# Patient Record
Sex: Male | Born: 1995
Health system: Southern US, Community
[De-identification: ages and names within clinical notes are randomized; demographics above are authoritative.]

## PROBLEM LIST (undated history)

## (undated) DIAGNOSIS — I639 Cerebral infarction, unspecified: Secondary | ICD-10-CM

## (undated) DIAGNOSIS — G43909 Migraine, unspecified, not intractable, without status migrainosus: Secondary | ICD-10-CM

## (undated) DIAGNOSIS — D571 Sickle-cell disease without crisis: Secondary | ICD-10-CM

## (undated) HISTORY — PX: CHOLECYSTECTOMY: SHX55

---
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
## 2014-01-23 ENCOUNTER — Encounter: Payer: Self-pay | Admitting: Internal Medicine

## 2014-01-23 ENCOUNTER — Ambulatory Visit (INDEPENDENT_AMBULATORY_CARE_PROVIDER_SITE_OTHER): Payer: BC Managed Care – PPO | Admitting: Internal Medicine

## 2014-01-23 VITALS — BP 131/46 | HR 68 | Temp 98.1°F | Resp 16 | Ht 66.5 in | Wt 157.0 lb

## 2014-01-23 DIAGNOSIS — D571 Sickle-cell disease without crisis: Secondary | ICD-10-CM

## 2014-01-23 LAB — COMPREHENSIVE METABOLIC PANEL
ALT: 17 U/L (ref 0–53)
AST: 41 U/L — AB (ref 0–37)
Albumin: 4.5 g/dL (ref 3.5–5.2)
Alkaline Phosphatase: 77 U/L (ref 39–117)
BILIRUBIN TOTAL: 3.2 mg/dL — AB (ref 0.2–1.1)
BUN: 7 mg/dL (ref 6–23)
CALCIUM: 9.2 mg/dL (ref 8.4–10.5)
CO2: 25 mEq/L (ref 19–32)
Chloride: 102 mEq/L (ref 96–112)
Creat: 0.66 mg/dL (ref 0.50–1.35)
Glucose, Bld: 86 mg/dL (ref 70–99)
Potassium: 4.6 mEq/L (ref 3.5–5.3)
Sodium: 136 mEq/L (ref 135–145)
Total Protein: 7.2 g/dL (ref 6.0–8.3)

## 2014-01-23 LAB — FERRITIN: FERRITIN: 46 ng/mL (ref 22–322)

## 2014-01-23 LAB — LACTATE DEHYDROGENASE: LDH: 469 U/L — ABNORMAL HIGH (ref 94–250)

## 2014-01-23 NOTE — Progress Notes (Signed)
Patient ID: Ryan Fowler, male   DOB: 04/23/1996, 18 y.o.   MRN: [id number]   Haynes Giannotti, is a 18 y.o. male  [id number]  [id number]  DOB - April 20, 1996  CC:  Chief Complaint  Patient presents with  . Establish Care       HPI: Renji Berwick is a 18 y.o. male here today to establish medical care. He is a pleasant 18 y/o with Hb SS who has previously been managed by Pediatric Hematology (Dr. Sinda Du) at [place] has a history of Acute Chest Syndrome requiring Red Cell Pheresis. He has had childhood CVA's and is on Hydrea at 28 mg/kg. He has received TCD's on a yearly basis since his CVA's. Pt also has had priapism in the past and has a short episode of priapism in the last few weeks. He has not had any pain episodes requiring  Hospitalization. Pt states that he takes mostly Tylenol and Ibuprofen but occasionally requires Hydromorphone a few times/year.  Pt's parents present for duration of visit.  Patient has No headache, No chest pain, No abdominal pain - No Nausea, No new weakness tingling or numbness, No Cough - SOB.  Allergies  Allergen Reactions  . Morphine And Related     Unbearable pain   Past Medical History  Diagnosis Date  . Sickle cell anemia   . Migraine   . CVA (cerebral infarction)    Current Outpatient Prescriptions on File Prior to Visit  Medication Sig Dispense Refill  . folic acid (FOLVITE) 1 MG tablet Take 1 mg by mouth daily.      Marland Kitchen HYDROmorphone (DILAUDID) 2 MG tablet Take 1 tablet (2 mg total) by mouth every 4 (four) hours as needed for severe pain.  15 tablet  0  . hydroxyurea (HYDREA) 500 MG capsule Take 1,500 mg by mouth daily. May take with food to minimize GI side effects.      Marland Kitchen l-methylfolate-B6-B12 (METANX) 3-35-2 MG TABS Take 1 tablet by mouth daily.      Marland Kitchen HYDROmorphone (DILAUDID) 2 MG tablet Take 2 mg by mouth every 4 (four) hours as needed for severe pain.       No current facility-administered medications on file prior to  visit.   No family history on file. History   Social History  . Marital Status: Single    Spouse Name: N/A    Number of Children: N/A  . Years of Education: N/A   Occupational History  . Not on file.   Social History Main Topics  . Smoking status: Former Research scientist (life sciences)  . Smokeless tobacco: Not on file  . Alcohol Use: No  . Drug Use: No  . Sexual Activity: Not on file   Other Topics Concern  . Not on file   Social History Narrative  . No narrative on file    Review of Systems: Constitutional: Negative for fever, chills, diaphoresis, activity change, appetite change and fatigue. HENT: Negative for ear pain, nosebleeds, congestion, facial swelling, rhinorrhea, neck pain, neck stiffness and ear discharge.  Eyes: Negative for pain, discharge, redness, itching and visual disturbance. Respiratory: Negative for cough, choking, chest tightness, shortness of breath, wheezing and stridor.  Cardiovascular: Negative for chest pain, palpitations and leg swelling. Gastrointestinal: Negative for abdominal distention. Genitourinary: Negative for dysuria, urgency, frequency, hematuria, flank pain, decreased urine volume, difficulty urinating and dyspareunia.  Musculoskeletal: Negative for back pain, joint swelling, arthralgia and gait problem. Neurological: Negative for dizziness, tremors, seizures, syncope, facial asymmetry, speech difficulty, weakness, light-headedness,  numbness and headaches.  Hematological: Negative for adenopathy. Does not bruise/bleed easily. Psychiatric/Behavioral: Negative for hallucinations, behavioral problems, confusion, dysphoric mood, decreased concentration and agitation.    Objective:   Filed Vitals:   01/23/14 1408  BP: 131/46  Pulse: 68  Temp: 98.1 F (36.7 C)  Resp: 16    Physical Exam: Constitutional: Patient appears well-developed and well-nourished. No distress. HENT: Normocephalic, atraumatic, External right and left ear normal. Oropharynx is clear  and moist.  Eyes: Conjunctivae and EOM are normal. PERRLA, no scleral icterus. Neck: Normal ROM. Neck supple. No JVD. No tracheal deviation. No thyromegaly. CVS: RRR, S1/S2 +, no murmurs, no gallops, no carotid bruit.  Pulmonary: Effort and breath sounds normal, no stridor, rhonchi, wheezes, rales.  Abdominal: Soft. BS +, no distension, tenderness, rebound or guarding.  Musculoskeletal: Normal range of motion. No edema and no tenderness.  Lymphadenopathy: No lymphadenopathy noted, cervical, inguinal or axillary Neuro: Alert. Normal reflexes, muscle tone coordination. No cranial nerve deficit. Skin: Skin is warm and dry. No rash noted. Not diaphoretic. No erythema. No pallor. Psychiatric: Normal mood and affect. Behavior, judgment, thought content normal.  Lab Results  Component Value Date   WBC 9.8 07/24/2013   HGB 11.9* 07/24/2013   HCT 35.0* 07/24/2013   MCV 101.9* 07/24/2013   PLT 761* 07/24/2013   Lab Results  Component Value Date   CREATININE 0.90 07/24/2013   BUN 4* 07/24/2013   NA 137 07/24/2013   K 3.8 07/24/2013   CL 108 07/24/2013    No results found for this basename: HGBA1C   Lipid Panel  No results found for this basename: chol, trig, hdl, cholhdl, vldl, ldlcalc       Assessment and plan:   Hb SS without Crisis - Sickle cell disease - Continue Hydrea 1500 mg  daily. We discussed the need for good hydration, monitoring of hydration status, avoidance of heat, cold, stress, and infection triggers. We discussed the risks and benefits of Hydrea, including bone marrow suppression, the possibility of GI upset, skin ulcers, hair thinning, and teratogenicity. The patient was reminded of the need to seek medical attention of any symptoms of bleeding, anemia, or infection. Continue folic acid 1 mg daily to prevent aplastic bone marrow crises.   - Pulmonary evaluation - Patient denies severe recurrent wheezes, shortness of breath with exercise, or persistent cough. If these symptoms develop,  pulmonary function tests with spirometry will be ordered, and if abnormal, plan on referral to Pulmonology for further evaluation.  - Cardiac - Routine screening for pulmonary hypertension is not recommended.  - Eye - High risk of proliferative retinopathy. Annual eye exam with retinal exam recommended to patient.  - Immunization status - She declines vaccines today. Yearly influenza vaccination is recommended, as well as being up to date with Meningococcal and Pneumococcal vaccines.   - Acute and chronic painful episodes - We agreed on Dilaudid 2 mg   q 4 hours as needed for pain. We discussed that he is currently receiving his Schedule II prescriptions only from Dr. Flossie Dibble (Hematologist). He is also aware that his prescription history is available to [place] online through the [employer]. We reminded (Pt) that all patients receiving Schedule II narcotics must be seen for follow up every three months. We reviewed the need to keep medicines in a safe locked location away from children or pets, and the need to report excess sedation or constipation, measures to avoid constipation, and policies related to early refills and stolen prescriptions. According to  the [place] [facility] program, we have reviewed details related to analgesia, adverse effects, aberrant behaviors.  - Iron overload from chronic transfusion.  Will check Ferritin levels  - Vitamin D deficiency - Pt at risk for vitamin D deficiency but he is not currently on Vitamin D supplementation. Will check Vitamin D levels   The above recommendations are taken from the NIH Evidence-Based Management of Sickle Cell Disease: Expert Panel Report, 20149.     Follow-up in 1 month  The patient was given clear instructions to go to ER or return to medical center if symptoms don't improve, worsen or new problems develop. The patient verbalized understanding. The patient was told to call to get lab results if they haven't heard anything in the next  week.     This note has been created with Surveyor, quantity. Any transcriptional errors are unintentional.    Romulo Okray A., MD [place], [place]   01/23/2014, 2:57 PM

## 2014-01-24 LAB — URINALYSIS
Bilirubin Urine: NEGATIVE
Glucose, UA: NEGATIVE mg/dL
HGB URINE DIPSTICK: NEGATIVE
Ketones, ur: NEGATIVE mg/dL
Leukocytes, UA: NEGATIVE
NITRITE: NEGATIVE
PROTEIN: NEGATIVE mg/dL
Specific Gravity, Urine: 1.01 (ref 1.005–1.030)
UROBILINOGEN UA: 0.2 mg/dL (ref 0.0–1.0)
pH: 5.5 (ref 5.0–8.0)

## 2014-01-24 LAB — CBC WITH DIFFERENTIAL/PLATELET
BASOS ABS: 0.1 10*3/uL (ref 0.0–0.1)
Basophils Relative: 1 % (ref 0–1)
Eosinophils Absolute: 0.1 10*3/uL (ref 0.0–0.7)
Eosinophils Relative: 1 % (ref 0–5)
HCT: 27.9 % — ABNORMAL LOW (ref 39.0–52.0)
Hemoglobin: 10.1 g/dL — ABNORMAL LOW (ref 13.0–17.0)
LYMPHS PCT: 41 % (ref 12–46)
Lymphs Abs: 2.3 10*3/uL (ref 0.7–4.0)
MCH: 40.4 pg — ABNORMAL HIGH (ref 26.0–34.0)
MCHC: 36.2 g/dL — AB (ref 30.0–36.0)
MCV: 111.6 fL — AB (ref 78.0–100.0)
MONOS PCT: 11 % (ref 3–12)
Monocytes Absolute: 0.6 10*3/uL (ref 0.1–1.0)
NEUTROS ABS: 2.6 10*3/uL (ref 1.7–7.7)
Neutrophils Relative %: 46 % (ref 43–77)
Platelets: 419 10*3/uL — ABNORMAL HIGH (ref 150–400)
RBC: 2.5 MIL/uL — ABNORMAL LOW (ref 4.22–5.81)
RDW: 19.3 % — AB (ref 11.5–15.5)
WBC: 5.7 10*3/uL (ref 4.0–10.5)

## 2014-01-24 LAB — VITAMIN D 25 HYDROXY (VIT D DEFICIENCY, FRACTURES): Vit D, 25-Hydroxy: 12 ng/mL — ABNORMAL LOW (ref 30–89)

## 2014-01-24 LAB — RETICULOCYTES
ABS RETIC: 257.5 10*3/uL — AB (ref 19.0–186.0)
RBC.: 2.5 MIL/uL — AB (ref 4.22–5.81)
Retic Ct Pct: 10.3 % — ABNORMAL HIGH (ref 0.4–2.3)

## 2014-01-27 DIAGNOSIS — D571 Sickle-cell disease without crisis: Secondary | ICD-10-CM | POA: Insufficient documentation

## 2014-02-27 ENCOUNTER — Ambulatory Visit: Payer: BC Managed Care – PPO | Admitting: Internal Medicine

## 2014-03-10 ENCOUNTER — Ambulatory Visit (INDEPENDENT_AMBULATORY_CARE_PROVIDER_SITE_OTHER): Payer: BC Managed Care – PPO | Admitting: Internal Medicine

## 2014-03-10 VITALS — BP 125/45 | HR 50 | Temp 98.0°F | Resp 14 | Ht 67.0 in | Wt 161.0 lb

## 2014-03-10 DIAGNOSIS — D571 Sickle-cell disease without crisis: Secondary | ICD-10-CM

## 2014-03-10 DIAGNOSIS — E559 Vitamin D deficiency, unspecified: Secondary | ICD-10-CM | POA: Insufficient documentation

## 2014-03-10 MED ORDER — ERGOCALCIFEROL 1.25 MG (50000 UT) PO CAPS: 50000.0000 [IU] | ORAL_CAPSULE | ORAL | Status: DC

## 2014-03-10 NOTE — Progress Notes (Signed)
Patient ID: Ryan Fowler, male   DOB: Nov 19, 1995, 18 y.o.   MRN: [id number] Patient ID: Ryan Fowler, male   DOB: 1995/10/01, 18 y.o.   MRN: [id number]   Ryan Fowler, is a 18 y.o. male  [id number]  [id number]  DOB - [birth year]  CC:  Chief Complaint  Patient presents with  . Follow-up       HPI: Ryan Fowler is a 18 y.o. male here today to establish medical care. He is a pleasant 18 y/o with Hb SS who has previously been managed by Pediatric Hematology (Dr. Sinda Du) at [facility] has a history of Acute Chest Syndrome requiring Red Cell Pheresis. He has had childhood CVA's and is on Hydrea at 28 mg/kg. He has received TCD's on a yearly basis since his CVA's. Pt also has had priapism in the past and has a short episode of priapism in the last few weeks. He has not had any pain episodes requiring  Hospitalization. Pt states that he takes mostly Tylenol and Ibuprofen but occasionally requires Hydromorphone a few times/year.  Pt's parents present for duration of visit.  Patient has No headache, No chest pain, No abdominal pain - No Nausea, No new weakness tingling or numbness, No Cough - SOB.  Allergies  Allergen Reactions  . Morphine And Related     Unbearable pain   Past Medical History  Diagnosis Date  . Sickle cell anemia   . Migraine   . CVA (cerebral infarction)    Current Outpatient Prescriptions on File Prior to Visit  Medication Sig Dispense Refill  . folic acid (FOLVITE) 1 MG tablet Take 1 mg by mouth daily.    Marland Kitchen HYDROmorphone (DILAUDID) 2 MG tablet Take 2 mg by mouth every 4 (four) hours as needed for severe pain.    Marland Kitchen HYDROmorphone (DILAUDID) 2 MG tablet Take 1 tablet (2 mg total) by mouth every 4 (four) hours as needed for severe pain. 15 tablet 0  . hydroxyurea (HYDREA) 500 MG capsule Take 2,000 mg by mouth daily. May take with food to minimize GI side effects.    Marland Kitchen l-methylfolate-B6-B12 (METANX) 3-35-2 MG TABS Take 1 tablet by mouth daily.      No current facility-administered medications on file prior to visit.   No family history on file. History   Social History  . Marital Status: Single    Spouse Name: N/A    Number of Children: N/A  . Years of Education: N/A   Occupational History  . Not on file.   Social History Main Topics  . Smoking status: Former Research scientist (life sciences)  . Smokeless tobacco: Not on file  . Alcohol Use: No  . Drug Use: No  . Sexual Activity: Not on file   Other Topics Concern  . Not on file   Social History Narrative  . No narrative on file    Review of Systems: Constitutional: Negative for fever, chills, diaphoresis, activity change, appetite change and fatigue. HENT: Negative for ear pain, nosebleeds, congestion, facial swelling, rhinorrhea, neck pain, neck stiffness and ear discharge.  Eyes: Negative for pain, discharge, redness, itching and visual disturbance. Respiratory: Negative for cough, choking, chest tightness, shortness of breath, wheezing and stridor.  Cardiovascular: Negative for chest pain, palpitations and leg swelling. Gastrointestinal: Negative for abdominal distention. Genitourinary: Negative for dysuria, urgency, frequency, hematuria, flank pain, decreased urine volume, difficulty urinating and dyspareunia.  Musculoskeletal: Negative for back pain, joint swelling, arthralgia and gait problem. Neurological: Negative for dizziness, tremors, seizures, syncope, facial asymmetry,  speech difficulty, weakness, light-headedness, numbness and headaches.  Hematological: Negative for adenopathy. Does not bruise/bleed easily. Psychiatric/Behavioral: Negative for hallucinations, behavioral problems, confusion, dysphoric mood, decreased concentration and agitation.    Objective:   Filed Vitals:   03/10/14 1546  BP: 125/45  Pulse: 50  Temp: 98 F (36.7 C)  Resp: 14    Physical Exam: Constitutional: Patient appears well-developed and well-nourished. No distress. HENT: Normocephalic,  atraumatic, External right and left ear normal. Oropharynx is clear and moist.  Eyes: Conjunctivae and EOM are normal. PERRLA, no scleral icterus. Neck: Normal ROM. Neck supple. No JVD. No tracheal deviation. No thyromegaly. CVS: RRR, S1/S2 +, no murmurs, no gallops, no carotid bruit.  Pulmonary: Effort and breath sounds normal, no stridor, rhonchi, wheezes, rales.  Abdominal: Soft. BS +, no distension, tenderness, rebound or guarding.  Musculoskeletal: Normal range of motion. No edema and no tenderness.  Lymphadenopathy: No lymphadenopathy noted, cervical, inguinal or axillary Neuro: Alert. Normal reflexes, muscle tone coordination. No cranial nerve deficit. Skin: Skin is warm and dry. No rash noted. Not diaphoretic. No erythema. No pallor. Psychiatric: Normal mood and affect. Behavior, judgment, thought content normal.  Lab Results  Component Value Date   WBC 5.7 01/23/2014   HGB 10.1* 01/23/2014   HCT 27.9* 01/23/2014   MCV 111.6* 01/23/2014   PLT 419* 01/23/2014   Lab Results  Component Value Date   CREATININE 0.66 01/23/2014   BUN 7 01/23/2014   NA 136 01/23/2014   K 4.6 01/23/2014   CL 102 01/23/2014   CO2 25 01/23/2014    No results found for: HGBA1C Lipid Panel  No results found for: CHOL     Assessment and plan:   Hb SS without Crisis - Sickle cell disease - Continue Hydrea 2000 mg  daily. We discussed the need for good hydration, monitoring of hydration status, avoidance of heat, cold, stress, and infection triggers. We discussed the risks and benefits of Hydrea, including bone marrow suppression, the possibility of GI upset, skin ulcers, hair thinning, and teratogenicity. The patient was reminded of the need to seek medical attention of any symptoms of bleeding, anemia, or infection. Continue folic acid 1 mg daily to prevent aplastic bone marrow crises.   - Pulmonary evaluation - Patient denies severe recurrent wheezes, shortness of breath with exercise, or  persistent cough. If these symptoms develop, pulmonary function tests with spirometry will be ordered, and if abnormal, plan on referral to Pulmonology for further evaluation.  - Cardiac - Routine screening for pulmonary hypertension is not recommended.  - Eye - High risk of proliferative retinopathy. Annual eye exam with retinal exam recommended to patient.  - Immunization status - She declines vaccines today. Yearly influenza vaccination is recommended, as well as being up to date with Meningococcal and Pneumococcal vaccines.   - Acute and chronic painful episodes - We agreed on Dilaudid 2 mg   q 4 hours as needed for pain. We discussed that he is currently receiving his Schedule II prescriptions only from Dr. Flossie Dibble (Hematologist). He is also aware that his prescription history is available to [place] online through the [place]. We reminded (Pt) that all patients receiving Schedule II narcotics must be seen for follow up every three months. We reviewed the need to keep medicines in a safe locked location away from children or pets, and the need to report excess sedation or constipation, measures to avoid constipation, and policies related to early refills and stolen prescriptions. According to the [place] Chronic Pain  Initiative program, we have reviewed details related to analgesia, adverse effects, aberrant behaviors.  - Iron overload from chronic transfusion.  Will check Ferritin levels  - Vitamin D deficiency - Vitamin D levels found to be low at 12. Will start on Vitamin D supplementation with Drisdol 50000 Units x 3 months and then recheck levels. If levels are withion normal limits will change to 2000 units daily.   The above recommendations are taken from the NIH Evidence-Based Management of Sickle Cell Disease: Expert Panel Report, 20149.     Follow-up in 3 months or PRN  The patient was given clear instructions to go to ER or return to medical center if symptoms don't improve, worsen or new  problems develop. The patient verbalized understanding. The patient was told to call to get lab results if they haven't heard anything in the next week.     This note has been created with Surveyor, quantity. Any transcriptional errors are unintentional.    Ryan Fowler A., MD [place], [place]   03/10/2014, 4:18 PM

## 2014-06-06 ENCOUNTER — Other Ambulatory Visit: Payer: BC Managed Care – PPO

## 2014-06-12 ENCOUNTER — Encounter: Payer: Self-pay | Admitting: Internal Medicine

## 2014-06-12 ENCOUNTER — Ambulatory Visit (INDEPENDENT_AMBULATORY_CARE_PROVIDER_SITE_OTHER): Payer: BLUE CROSS/BLUE SHIELD | Admitting: Internal Medicine

## 2014-06-12 VITALS — BP 118/48 | HR 54 | Temp 98.2°F | Resp 16 | Ht 67.0 in | Wt 163.0 lb

## 2014-06-12 DIAGNOSIS — D571 Sickle-cell disease without crisis: Secondary | ICD-10-CM

## 2014-06-12 NOTE — Progress Notes (Signed)
Patient ID: Ryan Fowler, male   DOB: April 01, 1996, 19 y.o.   MRN: [id number]   Ryan Fowler, is a 19 y.o. male  [id number]  [id number]  DOB - 14-Oct-1995  CC:  Chief Complaint  Patient presents with  . Follow-up       HPI: Ryan Fowler is a 19 y.o. male here today for 3 month follow up on Hb SS. Pt has been doing well and has had no pain requiring anything more than ibuprofen. He reports compliance with Hydroxyurea and Folic acid. His last visit to the Hematologist was 3 months ago.. Last vision examination about 1 year ago.  Patient has No headache, No chest pain, No abdominal pain - No Nausea, No new weakness tingling or numbness, No Cough - SOB.  Allergies  Allergen Reactions  . Morphine And Related     Unbearable pain   Past Medical History  Diagnosis Date  . Sickle cell anemia   . Migraine   . CVA (cerebral infarction)    Current Outpatient Prescriptions on File Prior to Visit  Medication Sig Dispense Refill  . folic acid (FOLVITE) 1 MG tablet Take 1 mg by mouth daily.    Marland Kitchen HYDROmorphone (DILAUDID) 2 MG tablet Take 2 mg by mouth every 4 (four) hours as needed for severe pain.    Marland Kitchen HYDROmorphone (DILAUDID) 2 MG tablet Take 1 tablet (2 mg total) by mouth every 4 (four) hours as needed for severe pain. 15 tablet 0  . hydroxyurea (HYDREA) 500 MG capsule Take 2,000 mg by mouth daily. May take with food to minimize GI side effects.    Marland Kitchen l-methylfolate-B6-B12 (METANX) 3-35-2 MG TABS Take 1 tablet by mouth daily.    . ergocalciferol (VITAMIN D2) 50000 UNITS capsule Take 1 capsule (50,000 Units total) by mouth once a week. (Patient not taking: Reported on 06/12/2014) 4 capsule 2   No current facility-administered medications on file prior to visit.   No family history on file. History   Social History  . Marital Status: Single    Spouse Name: N/A  . Number of Children: N/A  . Years of Education: N/A   Occupational History  . Not on file.   Social History Main  Topics  . Smoking status: Former Research scientist (life sciences)  . Smokeless tobacco: Not on file  . Alcohol Use: No  . Drug Use: No  . Sexual Activity: Not on file   Other Topics Concern  . Not on file   Social History Narrative  . No narrative on file    Review of Systems: Constitutional: Negative for fever, chills, diaphoresis, activity change, appetite change and fatigue. HENT: Negative for ear pain, nosebleeds, congestion, facial swelling, rhinorrhea, neck pain, neck stiffness and ear discharge.  Eyes: Negative for pain, discharge, redness, itching and visual disturbance. Respiratory: Negative for cough, choking, chest tightness, shortness of breath, wheezing and stridor.  Cardiovascular: Negative for chest pain, palpitations and leg swelling. Gastrointestinal: Negative for abdominal distention. Genitourinary: Negative for dysuria, urgency, frequency, hematuria, flank pain, decreased urine volume, and difficulty urinating .  Musculoskeletal: Negative for back pain, joint swelling, arthralgia and gait problem. Neurological: Negative for dizziness, tremors, seizures, syncope, facial asymmetry, speech difficulty, weakness, light-headedness, numbness and headaches.  Hematological: Negative for adenopathy. Does not bruise/bleed easily. Psychiatric/Behavioral: Negative for hallucinations, behavioral problems, confusion, dysphoric mood, decreased concentration and agitation.     Objective:   Filed Vitals:   06/12/14 1455  BP: 118/48  Pulse: 54  Temp: 98.2 F (36.8 C)  Resp:  16    Physical Exam: Constitutional: Patient appears well-developed and well-nourished. No distress. HENT: Normocephalic, atraumatic, External right and left ear normal. Oropharynx is clear and moist.  Eyes: Conjunctivae and EOM are normal. PERRLA, no scleral icterus. Neck: Normal ROM. Neck supple. No JVD. No tracheal deviation. No thyromegaly. CVS: RRR, S1/S2 +, no murmurs, no gallops, no carotid bruit.  Pulmonary: Effort and  breath sounds normal, no stridor, rhonchi, wheezes, rales.  Abdominal: Soft. BS +, no distension, tenderness, rebound or guarding.  Musculoskeletal: Normal range of motion. No edema and no tenderness.  Lymphadenopathy: No lymphadenopathy noted, cervical, inguinal or axillary Neuro: Alert. Normal reflexes, muscle tone coordination. No cranial nerve deficit. Skin: Skin is warm and dry. No rash noted. Not diaphoretic. No erythema. No pallor. Psychiatric: Normal mood and affect. Behavior, judgment, thought content normal.   Lab Results  Component Value Date   WBC 5.7 01/23/2014   HGB 10.1* 01/23/2014   HCT 27.9* 01/23/2014   MCV 111.6* 01/23/2014   PLT 419* 01/23/2014   Lab Results  Component Value Date   CREATININE 0.66 01/23/2014   BUN 7 01/23/2014   NA 136 01/23/2014   K 4.6 01/23/2014   CL 102 01/23/2014   CO2 25 01/23/2014    No results found for: HGBA1C Lipid Panel  No results found for: CHOL, TRIG, HDL, CHOLHDL, VLDL, LDLCALC     Assessment and plan:   1. Hb-SS disease without crisis 1. Sickle cell disease - Continue Hydrea 2000 mg daily. We discussed the need for good hydration, monitoring of hydration status, avoidance of heat, cold, stress, and infection triggers. We discussed the risks and benefits of Hydrea, including bone marrow suppression, the possibility of GI upset, skin ulcers, hair thinning, and teratogenicity. The patient was reminded of the need to seek medical attention of any symptoms of bleeding, anemia, or infection. Continue folic acid 1 mg daily to prevent aplastic bone marrow crises.   2. Pulmonary evaluation - Patient denies severe recurrent wheezes, shortness of breath with exercise, or persistent cough. If these symptoms develop, pulmonary function tests with spirometry will be ordered, and if abnormal, plan on referral to Pulmonology for further evaluation.  3. Cardiac - Routine screening for pulmonary hypertension is not recommended.  4. Eye -  High risk of proliferative retinopathy. Annual eye exam with retinal exam recommended to patient.  5. Immunization status - She declines vaccines today. Yearly influenza vaccination is recommended, as well as being up to date with Meningococcal and Pneumococcal vaccines.   6. Acute and chronic painful episodes - We agreed on (Opiate dose and amount of pills  per month) plan on titrating her Medication dose. We discussed that she is to receive her Schedule II prescriptions only from [place]. She is also aware that her prescription history is available to [place] online through the [place]. Controlled substance agreement signed (Date). We reminded (Pt) that all patients receiving Schedule II narcotics must be seen for follow up every three months. We reviewed the terms of our pain agreement, including the need to keep medicines in a safe locked location away from children or pets, and the need to report excess sedation or constipation, measures to avoid constipation, and policies related to early refills and stolen prescriptions. According to the [place] Chronic Pain Initiative program, we have reviewed details related to analgesia, adverse effects, aberrant behaviors.  7. Iron overload from chronic transfusion.  Exjade (dose and frequency)  8. Vitamin D deficiency - Last Vitamin D levels normal  -  CBC with Differential/Platelet - Reticulocytes - Urinalysis  The above recommendations are taken from the NIH Evidence-Based Management of Sickle Cell Disease: Expert Panel Report, 20149.   Follow-up 6 months.  The patient was given clear instructions to go to ER or return to medical center if symptoms don't improve, worsen or new problems develop. The patient verbalized understanding. The patient was told to call to get lab results if they haven't heard anything in the next week.     This note has been created with Surveyor, quantity. Any transcriptional errors are  unintentional.    MATTHEWS,MICHELLE A., MD [place], [place]   06/12/2014, 3:23 PM

## 2014-06-13 LAB — RETICULOCYTES
ABS RETIC: 178.6 10*3/uL (ref 19.0–186.0)
RBC.: 2.48 MIL/uL — AB (ref 4.22–5.81)
Retic Ct Pct: 7.2 % — ABNORMAL HIGH (ref 0.4–2.3)

## 2014-06-13 LAB — CBC WITH DIFFERENTIAL/PLATELET
Basophils Absolute: 0.1 10*3/uL (ref 0.0–0.1)
Basophils Relative: 1 % (ref 0–1)
EOS PCT: 1 % (ref 0–5)
Eosinophils Absolute: 0.1 10*3/uL (ref 0.0–0.7)
HEMATOCRIT: 30.5 % — AB (ref 39.0–52.0)
Hemoglobin: 10.8 g/dL — ABNORMAL LOW (ref 13.0–17.0)
LYMPHS PCT: 36 % (ref 12–46)
Lymphs Abs: 2.4 10*3/uL (ref 0.7–4.0)
MCH: 43.5 pg — ABNORMAL HIGH (ref 26.0–34.0)
MCHC: 35.4 g/dL (ref 30.0–36.0)
MCV: 123 fL — ABNORMAL HIGH (ref 78.0–100.0)
MONO ABS: 0.6 10*3/uL (ref 0.1–1.0)
MONOS PCT: 9 % (ref 3–12)
MPV: 9.4 fL (ref 8.6–12.4)
Neutro Abs: 3.5 10*3/uL (ref 1.7–7.7)
Neutrophils Relative %: 53 % (ref 43–77)
Platelets: 1021 10*3/uL — ABNORMAL HIGH (ref 150–400)
RBC: 2.48 MIL/uL — ABNORMAL LOW (ref 4.22–5.81)
RDW: 18.2 % — AB (ref 11.5–15.5)
WBC: 6.6 10*3/uL (ref 4.0–10.5)

## 2014-06-13 LAB — URINALYSIS
Bilirubin Urine: NEGATIVE
Glucose, UA: NEGATIVE mg/dL
Hgb urine dipstick: NEGATIVE
Ketones, ur: NEGATIVE mg/dL
LEUKOCYTES UA: NEGATIVE
Nitrite: NEGATIVE
PH: 7 (ref 5.0–8.0)
Protein, ur: NEGATIVE mg/dL
SPECIFIC GRAVITY, URINE: 1.012 (ref 1.005–1.030)
UROBILINOGEN UA: 1 mg/dL (ref 0.0–1.0)

## 2014-06-16 LAB — PATHOLOGIST SMEAR REVIEW

## 2014-07-21 ENCOUNTER — Telehealth: Payer: Self-pay | Admitting: Internal Medicine

## 2014-07-21 NOTE — Telephone Encounter (Signed)
RX originally filled by Neurologist. Please call  [phone] -directly to company that manufactures medication for patient to receive a substantial discount.

## 2014-07-22 NOTE — Telephone Encounter (Signed)
This has been refilled into mail order. Thanks!

## 2014-08-29 ENCOUNTER — Other Ambulatory Visit (INDEPENDENT_AMBULATORY_CARE_PROVIDER_SITE_OTHER): Payer: BLUE CROSS/BLUE SHIELD

## 2014-08-29 ENCOUNTER — Other Ambulatory Visit: Payer: Self-pay | Admitting: Internal Medicine

## 2014-08-29 DIAGNOSIS — D571 Sickle-cell disease without crisis: Secondary | ICD-10-CM

## 2014-08-29 DIAGNOSIS — Z79899 Other long term (current) drug therapy: Secondary | ICD-10-CM

## 2014-08-29 DIAGNOSIS — Z7964 Long term (current) use of myelosuppressive agent: Secondary | ICD-10-CM

## 2014-08-30 LAB — CBC WITH DIFFERENTIAL/PLATELET
BASOS ABS: 0.1 10*3/uL (ref 0.0–0.1)
Basophils Relative: 1 % (ref 0–1)
EOS PCT: 1 % (ref 0–5)
Eosinophils Absolute: 0.1 10*3/uL (ref 0.0–0.7)
HCT: 29.8 % — ABNORMAL LOW (ref 39.0–52.0)
HEMOGLOBIN: 10.7 g/dL — AB (ref 13.0–17.0)
LYMPHS ABS: 3 10*3/uL (ref 0.7–4.0)
LYMPHS PCT: 31 % (ref 12–46)
MCH: 43.3 pg — AB (ref 26.0–34.0)
MCHC: 35.9 g/dL (ref 30.0–36.0)
MCV: 120.6 fL — ABNORMAL HIGH (ref 78.0–100.0)
MPV: 9.9 fL (ref 8.6–12.4)
Monocytes Absolute: 0.7 10*3/uL (ref 0.1–1.0)
Monocytes Relative: 7 % (ref 3–12)
NEUTROS ABS: 5.8 10*3/uL (ref 1.7–7.7)
NEUTROS PCT: 60 % (ref 43–77)
PLATELETS: 300 10*3/uL (ref 150–400)
RBC: 2.47 MIL/uL — AB (ref 4.22–5.81)
RDW: 18 % — ABNORMAL HIGH (ref 11.5–15.5)
WBC: 9.7 10*3/uL (ref 4.0–10.5)

## 2014-08-30 LAB — RETICULOCYTES
ABS Retic: 170.4 10*3/uL (ref 19.0–186.0)
RBC.: 2.47 MIL/uL — AB (ref 4.22–5.81)
Retic Ct Pct: 6.9 % — ABNORMAL HIGH (ref 0.4–2.3)

## 2014-12-22 ENCOUNTER — Telehealth (HOSPITAL_COMMUNITY): Payer: Self-pay | Admitting: *Deleted

## 2015-01-12 ENCOUNTER — Encounter: Payer: BLUE CROSS/BLUE SHIELD | Admitting: Family Medicine

## 2015-03-12 ENCOUNTER — Ambulatory Visit (INDEPENDENT_AMBULATORY_CARE_PROVIDER_SITE_OTHER): Payer: BLUE CROSS/BLUE SHIELD | Admitting: Family Medicine

## 2015-03-12 VITALS — BP 140/50 | HR 68 | Temp 98.2°F | Resp 16 | Ht 67.0 in | Wt 159.0 lb

## 2015-03-12 DIAGNOSIS — E559 Vitamin D deficiency, unspecified: Secondary | ICD-10-CM

## 2015-03-12 DIAGNOSIS — D571 Sickle-cell disease without crisis: Secondary | ICD-10-CM

## 2015-03-12 DIAGNOSIS — Z202 Contact with and (suspected) exposure to infections with a predominantly sexual mode of transmission: Secondary | ICD-10-CM | POA: Diagnosis not present

## 2015-03-12 DIAGNOSIS — N4889 Other specified disorders of penis: Secondary | ICD-10-CM | POA: Diagnosis not present

## 2015-03-12 DIAGNOSIS — N489 Disorder of penis, unspecified: Secondary | ICD-10-CM

## 2015-03-12 LAB — COMPLETE METABOLIC PANEL WITH GFR
ALT: 13 U/L (ref 9–46)
AST: 25 U/L (ref 10–40)
Albumin: 4.6 g/dL (ref 3.6–5.1)
Alkaline Phosphatase: 69 U/L (ref 40–115)
BUN: 6 mg/dL — AB (ref 7–25)
CALCIUM: 9.6 mg/dL (ref 8.6–10.3)
CHLORIDE: 99 mmol/L (ref 98–110)
CO2: 26 mmol/L (ref 20–31)
Creat: 0.61 mg/dL (ref 0.60–1.35)
GFR, Est African American: >89 mL/min (ref >=60)
Glucose, Bld: 80 mg/dL (ref 65–99)
POTASSIUM: 4.3 mmol/L (ref 3.5–5.3)
Sodium: 137 mmol/L (ref 135–146)
Total Bilirubin: 2.4 mg/dL — ABNORMAL HIGH (ref 0.2–1.2)
Total Protein: 7.9 g/dL (ref 6.1–8.1)

## 2015-03-12 NOTE — Patient Instructions (Signed)
Will follow up by phone on 03/13/2015 Recommend barrier protection for sexual intercourseSickle Cell Anemia, Adult Sickle cell anemia is a condition in which red blood cells have an abnormal "sickle" shape. This abnormal shape shortens the cells' life span, which results in a lower than normal concentration of red blood cells in the blood. The sickle shape also causes the cells to clump together and block free blood flow through the blood vessels. As a result, the tissues and organs of the body do not receive enough oxygen. Sickle cell anemia causes organ damage and pain and increases the risk of infection. CAUSES  Sickle cell anemia is a genetic disorder. Those who receive two copies of the gene have the condition, and those who receive one copy have the trait. RISK FACTORS The sickle cell gene is most common in people whose families originated in [place]. Other areas of the globe where sickle cell trait occurs include the [place], [place] and [place], and the [place].  SIGNS AND SYMPTOMS  Pain, especially in the extremities, back, chest, or abdomen (common). The pain may start suddenly or may develop following an illness, especially if there is dehydration. Pain can also occur due to overexertion or exposure to extreme temperature changes.  Frequent severe bacterial infections, especially certain types of pneumonia and meningitis.  Pain and swelling in the hands and feet.  Decreased activity.   Loss of appetite.   Change in behavior.  Headaches.  Seizures.  Shortness of breath or difficulty breathing.  Vision changes.  Skin ulcers. Those with the trait may not have symptoms or they may have mild symptoms.  DIAGNOSIS  Sickle cell anemia is diagnosed with blood tests that demonstrate the genetic trait. It is often diagnosed during the newborn period, due to mandatory testing nationwide. A variety of blood tests, X-rays, CT scans, MRI scans, ultrasounds,  and lung function tests may also be done to monitor the condition. TREATMENT  Sickle cell anemia may be treated with:  Medicines. You may be given pain medicines, antibiotic medicines (to treat and prevent infections) or medicines to increase the production of certain types of hemoglobin.  Fluids.  Oxygen.  Blood transfusions. HOME CARE INSTRUCTIONS   Drink enough fluid to keep your urine clear or pale yellow. Increase your fluid intake in hot weather and during exercise.  Do not smoke. Smoking lowers oxygen levels in the blood.   Only take over-the-counter or prescription medicines for pain, fever, or discomfort as directed by your health care provider.  Take antibiotics as directed by your health care provider. Make sure you finish them it even if you start to feel better.   Take supplements as directed by your health care provider.   Consider wearing a medical alert bracelet. This tells anyone caring for you in an emergency of your condition.   When traveling, keep your medical information, health care provider's names, and the medicines you take with you at all times.   If you develop a fever, do not take medicines to reduce the fever right away. This could cover up a problem that is developing. Notify your health care provider.  Keep all follow-up appointments with your health care provider. Sickle cell anemia requires regular medical care. SEEK MEDICAL CARE IF: You have a fever. SEEK IMMEDIATE MEDICAL CARE IF:   You feel dizzy or faint.   You have new abdominal pain, especially on the left side near the stomach area.   You develop a persistent, often uncomfortable  and painful penile erection (priapism). If this is not treated immediately it will lead to impotence.   You have numbness your arms or legs or you have a hard time moving them.   You have a hard time with speech.   You have a fever or persistent symptoms for more than 2-3 days.   You have a  fever and your symptoms suddenly get worse.   You have signs or symptoms of infection. These include:   Chills.   Abnormal tiredness (lethargy).   Irritability.   Poor eating.   Vomiting.   You develop pain that is not helped with medicine.   You develop shortness of breath.  You have pain in your chest.   You are coughing up pus-like or bloody sputum.   You develop a stiff neck.  Your feet or hands swell or have pain.  Your abdomen appears bloated.  You develop joint pain. MAKE SURE YOU:  Understand these instructions.   This information is not intended to replace advice given to you by your health care provider. Make sure you discuss any questions you have with your health care provider.   Document Released: 07/20/2005 Document Revised: 05/02/2014 Document Reviewed: 11/21/2012 Elsevier Interactive Patient Education [employer].

## 2015-03-12 NOTE — Progress Notes (Signed)
Subjective:    Patient ID: Ryan Fowler, male    DOB: [birth year], 19 y.o.   MRN: [id number]  HPI [name], a 19 year old male with a history of sickle cell HbSS. He has previously been managed by Dr. Sinda Du at [facility] in [place], [place]. Patient states that he has been taking all medications consistently including Hydroxyurea. He states that he rarely uses opiate medications, its typically a few times per year. He primarily uses Tylenol or Ibuprofen.   Ryan Fowler is complaining of "bumps" on his penis for the past several weeks. He states that he has been have unprotected sexual intercourse. He describes eruptions as painless and without discharge. He denies fatigue, fever, nausea, vomiting or diarrhea. He states that his partner(s) deny having similar symptoms.     Medication List       This list is accurate as of: 03/12/15 11:59 PM.  Always use your most recent med list.               ergocalciferol 50000 UNITS capsule  Commonly known as:  VITAMIN D2  Take 1 capsule (50,000 Units total) by mouth once a week.     folic acid 1 MG tablet  Commonly known as:  FOLVITE  Take 1 mg by mouth daily.     HYDROmorphone 2 MG tablet  Commonly known as:  DILAUDID  Take 2 mg by mouth every 4 (four) hours as needed for severe pain.     HYDROmorphone 2 MG tablet  Commonly known as:  DILAUDID  Take 1 tablet (2 mg total) by mouth every 4 (four) hours as needed for severe pain.     hydroxyurea 500 MG capsule  Commonly known as:  HYDREA  Take 2,000 mg by mouth daily. May take with food to minimize GI side effects.     l-methylfolate-B6-B12 3-35-2 MG Tabs tablet  Commonly known as:  METANX  Take 1 tablet by mouth daily.        Past Medical History  Diagnosis Date  . Sickle cell anemia   . Migraine   . CVA (cerebral infarction)    There is no immunization history on file for this patient. Review of Systems  Constitutional: Negative.  Negative for fever,  fatigue and unexpected weight change.  HENT: Negative.   Eyes: Negative.   Respiratory: Negative.   Cardiovascular: Negative.   Gastrointestinal: Negative.   Endocrine: Negative.  Negative for polydipsia, polyphagia and polyuria.  Genitourinary: Positive for genital sores (painless). Negative for dysuria, frequency, hematuria, flank pain, discharge, enuresis, difficulty urinating and penile pain.  Musculoskeletal: Positive for myalgias (occasional related to sickle cell anemia).  Skin: Negative.   Allergic/Immunologic: Negative.   Neurological: Negative.   Hematological: Negative.   Psychiatric/Behavioral: Negative.        Objective:   Physical Exam  Constitutional: He is oriented to person, place, and time. He appears well-developed and well-nourished.  HENT:  Head: Normocephalic and atraumatic.  Right Ear: External ear normal.  Left Ear: External ear normal.  Mouth/Throat: Oropharynx is clear and moist.  Eyes: Conjunctivae and EOM are normal. Pupils are equal, round, and reactive to light.  Neck: Normal range of motion. Neck supple.  Cardiovascular: Normal rate, regular rhythm, normal heart sounds and intact distal pulses.   Pulmonary/Chest: Effort normal and breath sounds normal.  Abdominal: Soft. Bowel sounds are normal.  Genitourinary: Testes normal.    Circumcised.  Musculoskeletal: Normal range of motion.  Neurological: He is alert  and oriented to person, place, and time. He has normal reflexes.  Skin: Skin is warm and dry. No rash noted.  Psychiatric: He has a normal mood and affect. His behavior is normal. Judgment and thought content normal.         BP 140/50 mmHg  Pulse 68  Temp(Src) 98.2 F (36.8 C) (Oral)  Resp 16  Ht 5\' 7"  (1.702 m)  Wt 159 lb (72.122 kg)  BMI 24.90 kg/m2 Assessment & Plan:  1. Hb-SS disease without crisis [facility]) Will send a referral to an adult hematologist. 1. Sickle cell disease - Continue Hydrea daily. We discussed the need for  good hydration, monitoring of hydration status, avoidance of heat, cold, stress, and infection triggers. We discussed the risks and benefits of Hydrea, including bone marrow suppression, the possibility of GI upset, skin ulcers, hair thinning, and teratogenicity. The patient was reminded of the need to seek medical attention of any symptoms of bleeding, anemia, or infection.  Pulmonary evaluation - Patient denies severe recurrent wheezes, shortness of breath with exercise, or persistent cough. If these symptoms develop, pulmonary function tests with spirometry will be ordered, and if abnormal, plan on referral to Pulmonology for further evaluation.   Cardiac - Routine screening for pulmonary hypertension is not recommended.  Eye - High risk of proliferative retinopathy. Annual eye exam with retinal exam recommended to patient.  Immunization status - Will receive influenza vaccination today. Will check [facility] to inquire about HPV and meningiococcal vaccinations.   Acute and chronic painful episodes -  Opiate medications are prescribed by hematologist. - CBC with Differential - COMPLETE METABOLIC PANEL WITH GFR - Reticulocytes - Flu Vaccine QUAD 36+ mos PF IM (Fluarix & Fluzone Quad PF)  2. Vitamin D deficiency - Vitamin D, 25-hydroxy  3. Penile lesion I suspect genital warts due to color, pattern and texture. Will r/o herpes simplex virus. May require biopsy for further evaluation.  - HSV 2 antibody, IgG  4. Possible exposure to STD Recommend that patient utilize barrier protection with sexual intercourse.  - GC/Chlamydia Probe Amp - HIV antibody (with reflex) - RPR  RTC: Will follow up with patient by phone  Dorena Dew, FNP

## 2015-03-13 ENCOUNTER — Other Ambulatory Visit: Payer: Self-pay | Admitting: Family Medicine

## 2015-03-13 LAB — GC/CHLAMYDIA PROBE AMP
CT PROBE, AMP APTIMA: NEGATIVE
GC PROBE AMP APTIMA: NEGATIVE

## 2015-03-13 LAB — CBC WITH DIFFERENTIAL/PLATELET
Basophils Absolute: 0 10*3/uL (ref 0.0–0.1)
Basophils Relative: 0 % (ref 0–1)
Eosinophils Absolute: 0 10*3/uL (ref 0.0–0.7)
Eosinophils Relative: 1 % (ref 0–5)
HEMATOCRIT: 30.7 % — AB (ref 39.0–52.0)
Hemoglobin: 11.1 g/dL — ABNORMAL LOW (ref 13.0–17.0)
Lymphocytes Relative: 39 % (ref 12–46)
Lymphs Abs: 1.9 10*3/uL (ref 0.7–4.0)
MCH: 42.7 pg — ABNORMAL HIGH (ref 26.0–34.0)
MCHC: 36.2 g/dL — AB (ref 30.0–36.0)
MCV: 118.1 fL — ABNORMAL HIGH (ref 78.0–100.0)
MONO ABS: 0.3 10*3/uL (ref 0.1–1.0)
MONOS PCT: 7 % (ref 3–12)
MPV: 9.5 fL (ref 8.6–12.4)
NEUTROS ABS: 2.5 10*3/uL (ref 1.7–7.7)
Neutrophils Relative %: 53 % (ref 43–77)
Platelets: 1039 10*3/uL — ABNORMAL HIGH (ref 150–400)
RBC: 2.6 MIL/uL — ABNORMAL LOW (ref 4.22–5.81)
RDW: 17.7 % — ABNORMAL HIGH (ref 11.5–15.5)
WBC: 4.8 10*3/uL (ref 4.0–10.5)

## 2015-03-13 LAB — HIV ANTIBODY (ROUTINE TESTING W REFLEX): HIV 1&2 Ab, 4th Generation: NONREACTIVE

## 2015-03-13 LAB — VITAMIN D 25 HYDROXY (VIT D DEFICIENCY, FRACTURES): VIT D 25 HYDROXY: 16 ng/mL — AB (ref 30–100)

## 2015-03-13 LAB — RPR

## 2015-03-13 LAB — RETICULOCYTES
ABS RETIC: 169 10*3/uL (ref 19.0–186.0)
RBC.: 2.6 MIL/uL — ABNORMAL LOW (ref 4.22–5.81)
Retic Ct Pct: 6.5 % — ABNORMAL HIGH (ref 0.4–2.3)

## 2015-03-13 LAB — HSV 2 ANTIBODY, IGG: HSV 2 Glycoprotein G Ab, IgG: <0.1 IV

## 2015-03-14 ENCOUNTER — Encounter: Payer: Self-pay | Admitting: Family Medicine

## 2015-03-27 ENCOUNTER — Ambulatory Visit (INDEPENDENT_AMBULATORY_CARE_PROVIDER_SITE_OTHER): Payer: BLUE CROSS/BLUE SHIELD | Admitting: Family Medicine

## 2015-03-27 VITALS — BP 115/44 | HR 52 | Temp 98.1°F | Resp 16 | Ht 67.0 in | Wt 158.0 lb

## 2015-03-27 DIAGNOSIS — Z7189 Other specified counseling: Secondary | ICD-10-CM

## 2015-03-27 DIAGNOSIS — D473 Essential (hemorrhagic) thrombocythemia: Secondary | ICD-10-CM | POA: Diagnosis not present

## 2015-03-27 DIAGNOSIS — D75839 Thrombocytosis, unspecified: Secondary | ICD-10-CM

## 2015-03-27 DIAGNOSIS — D571 Sickle-cell disease without crisis: Secondary | ICD-10-CM

## 2015-03-27 DIAGNOSIS — N4889 Other specified disorders of penis: Secondary | ICD-10-CM

## 2015-03-27 DIAGNOSIS — Z23 Encounter for immunization: Secondary | ICD-10-CM

## 2015-03-27 DIAGNOSIS — Z7185 Encounter for immunization safety counseling: Secondary | ICD-10-CM

## 2015-03-27 DIAGNOSIS — N489 Disorder of penis, unspecified: Secondary | ICD-10-CM

## 2015-03-27 MED ORDER — ASPIRIN EC 81 MG PO TBEC: 81.0000 mg | DELAYED_RELEASE_TABLET | Freq: Every day | ORAL | Status: AC

## 2015-03-27 NOTE — Patient Instructions (Signed)
Human Papillomavirus Human papillomavirus (HPV) is the most common sexually transmitted infection (STI) and is highly contagious. HPV infections cause genital warts and cancers to the outlet of the womb (cervix), birth canal (vagina), opening of the birth canal (vulva), and anus. There are over 100 types of HPV. Unless wartlike lesions are present in the throat or there are genital warts that you can see or feel, HPV usually does not cause symptoms. It is possible to be infected for long periods and pass it on to others without knowing it. CAUSES  HPV is spread from person to person through sexual contact. This includes oral, vaginal, or anal sex. RISK FACTORS  Having unprotected sex. HPV can be spread by oral, vaginal, or anal sex.  Having several sex partners.  Having a sex partner who has other sex partners.  Having or having had another sexually transmitted infection. SIGNS AND SYMPTOMS  Most people carrying HPV do not have any symptoms. If symptoms are present, symptoms may include:  Wartlike lesions in the throat (from having oral sex).  Warts in the infected skin or mucous membranes.  Genital warts that may itch, burn, or bleed.  Genital warts that may be painful or bleed during sexual intercourse. DIAGNOSIS  If wartlike lesions are present in the throat or genital warts are present, your health care provider can usually diagnose HPV by physical examination.   Genital warts are easily seen with the naked eye.  Currently, there is no FDA-approved test to detect HPV in males.  In females, a Pap test can show cells that are infected with HPV.  In females, a scope can be used to view the cervix (colposcopy). A colposcopy can be performed if the pelvic exam or Pap test is abnormal. A sample of tissue may be removed (biopsy) during the colposcopy. TREATMENT  There is no treatment for the virus itself. However, there are treatments for the health problems and symptoms HPV can cause.  Your health care provider will follow you closely after you are treated. This is because the HPV can come back and may need treatment again. Treatment of HPV may include:   Medicines, which may be injected or applied in a cream, lotion, or gel form.  Use of a probe to apply extreme cold (cryotherapy).  Application of an intense beam of light (laser treatment).  Use of a probe to apply extreme heat (electrocautery).  Surgery. HOME CARE INSTRUCTIONS   Take medicines only as directed by your health care provider.  Use over-the-counter creams for itching or irritation as directed by your health care provider.  Keep all follow-up visits as directed by your health care provider. This is important.  Do not touch or scratch the warts.  Do not treat genital warts with medicines used for treating hand warts.  Do not have sex while you are being treated.  Do not douche or use tampons during treatment of HPV.  Tell your sex partner about your infection because he or she may also need treatment.  If you become pregnant, tell your health care provider that you have had HPV. Your health care provider will monitor you closely during pregnancy to be sure your baby is safe.  After treatment, use condoms during sex to prevent future infections.  Have only one sex partner.  Have a sex partner who does not have other sex partners. PREVENTION   Talk to your health care provider about getting the HPV vaccines. These vaccines prevent some HPV infections and cancers.  It is recommended that the vaccine be given to males and females between the ages of 29 and 37 years old. It will not work if you already have HPV, and it is not recommended for pregnant women.  A Pap test is done to screen for cervical cancer in women.  The first Pap test should be done at age 45 years.  Between ages 55 and 34 years, Pap tests are repeated every 2 years.  Beginning at age 31, you are advised to have a Pap test every  3 years as long as your past 3 Pap tests have been normal.  Some women have medical problems that increase the chance of getting cervical cancer. Talk to your health care provider about these problems. It is especially important to talk to your health care provider if a new problem develops soon after your last Pap test. In these cases, your health care provider may recommend more frequent screening and Pap tests.  The above recommendations are the same for women who have or have not gotten the vaccine for HPV.  If you had a hysterectomy for a problem that was not a cancer or a condition that could lead to cancer, then you no longer need Pap tests. However, even if you no longer need a Pap test, a regular exam is a good idea to make sure no other problems are starting.   If you are between the ages of 45 and 51 years and you have had normal Pap tests going back 10 years, you no longer need Pap tests. However, even if you no longer need a Pap test, a regular exam is a good idea to make sure no other problems are starting.  If you have had past treatment for cervical cancer or a condition that could lead to cancer, you need Pap tests and screening for cancer for at least 20 years after your treatment.  If Pap tests have been discontinued, risk factors (such as a new sexual partner)need to be reassessed to determine if screening should be resumed.  Some women may need screenings more often if they are at high risk for cervical cancer. SEEK MEDICAL CARE IF:   The treated skin becomes red, swollen, or painful.  You have a fever.  You feel generally ill.  You feel lumps or pimple-like projections in and around your genital area.  You develop bleeding of the vagina or the treatment area.  You have painful sexual intercourse. MAKE SURE YOU:   Understand these instructions.  Will watch your condition.  Will get help if you are not doing well or get worse.   This information is not  intended to replace advice given to you by your health care provider. Make sure you discuss any questions you have with your health care provider.   Document Released: 07/02/2003 Document Revised: 05/02/2014 Document Reviewed: 07/17/2013 Elsevier Interactive Patient Education 2016 Elsevier Inc. Human Papillomavirus Quadrivalent Vaccine suspension for injection What is this medicine? HUMAN PAPILLOMAVIRUS VACCINE (HYOO muhn pap uh LOH muh vahy ruhs vak SEEN) is a vaccine. It is used to prevent infections of four types of the human papillomavirus. In women, the vaccine may lower your risk of getting cervical, vaginal, vulvar, or anal cancer and genital warts. In men, the vaccine may lower your risk of getting genital warts and anal cancer. You cannot get these diseases from the vaccine. This vaccine does not treat these diseases. This medicine may be used for other purposes; ask your health care  provider or pharmacist if you have questions. What should I tell my health care provider before I take this medicine? They need to know if you have any of these conditions: -fever or infection -hemophilia -HIV infection or AIDS -immune system problems -low platelet count -an unusual reaction to Human Papillomavirus Vaccine, yeast, other medicines, foods, dyes, or preservatives -pregnant or trying to get pregnant -breast-feeding How should I use this medicine? This vaccine is for injection in a muscle on your upper arm or thigh. It is given by a health care professional. Dennis Bast will be observed for 15 minutes after each dose. Sometimes, fainting happens after the vaccine is given. You may be asked to sit or lie down during the 15 minutes. Three doses are given. The second dose is given 2 months after the first dose. The last dose is given 4 months after the second dose. A copy of a Vaccine Information Statement will be given before each vaccination. Read this sheet carefully each time. The sheet may change  frequently. Talk to your pediatrician regarding the use of this medicine in children. While this drug may be prescribed for children as young as 59 years of age for selected conditions, precautions do apply. Overdosage: If you think you have taken too much of this medicine contact a poison control center or emergency room at once. NOTE: This medicine is only for you. Do not share this medicine with others. What if I miss a dose? All 3 doses of the vaccine should be given within 6 months. Remember to keep appointments for follow-up doses. Your health care provider will tell you when to return for the next vaccine. Ask your health care professional for advice if you are unable to keep an appointment or miss a scheduled dose. What may interact with this medicine? -other vaccines This list may not describe all possible interactions. Give your health care provider a list of all the medicines, herbs, non-prescription drugs, or dietary supplements you use. Also tell them if you smoke, drink alcohol, or use illegal drugs. Some items may interact with your medicine. What should I watch for while using this medicine? This vaccine may not fully protect everyone. Continue to have regular pelvic exams and cervical or anal cancer screenings as directed by your doctor. The Human Papillomavirus is a sexually transmitted disease. It can be passed by any kind of sexual activity that involves genital contact. The vaccine works best when given before you have any contact with the virus. Many people who have the virus do not have any signs or symptoms. Tell your doctor or health care professional if you have any reaction or unusual symptom after getting the vaccine. What side effects may I notice from receiving this medicine? Side effects that you should report to your doctor or health care professional as soon as possible: -allergic reactions like skin rash, itching or hives, swelling of the face, lips, or  tongue -breathing problems -feeling faint or lightheaded, falls Side effects that usually do not require medical attention (report to your doctor or health care professional if they continue or are bothersome): -cough -dizziness -fever -headache -nausea -redness, warmth, swelling, pain, or itching at site where injected This list may not describe all possible side effects. Call your doctor for medical advice about side effects. You may report side effects to FDA at 1-800-FDA-1088. Where should I keep my medicine? This drug is given in a hospital or clinic and will not be stored at home. NOTE: This sheet is  a summary. It may not cover all possible information. If you have questions about this medicine, talk to your doctor, pharmacist, or health care provider.    2016, Elsevier/Gold Standard. (2013-06-03 13:14:33)

## 2015-03-29 ENCOUNTER — Encounter: Payer: Self-pay | Admitting: Family Medicine

## 2015-03-29 NOTE — Progress Notes (Signed)
Subjective:    Patient ID: Ryan Fowler, male    DOB: 1996/03/13, 19 y.o.   MRN: [id number]  HPI Ryan Fowler, a 19 year old male with a history of sickle cell HbSS, presents for follow up of sickle cell anemia and penile lesions.  Patient states that he has been taking all medications consistently including Hydroxyurea. He states that he rarely uses opiate medications, its typically a few times per year. He primarily uses Tylenol or Ibuprofen.   Ryan Fowler is also following up for papules on penis. Patient was screened for sexually transmitted diseases on previous appointment. He states that he has not changed sexual partner.  He states that he has been have unprotected sexual intercourse. He describes eruptions as painless and without discharge. He denies fatigue, fever, nausea, vomiting or diarrhea. He states that his partner(s) deny having similar symptoms.     Medication List       This list is accurate as of: 03/27/15 11:59 PM.  Always use your most recent med list.               aspirin EC 81 MG tablet  Take 1 tablet (81 mg total) by mouth daily.     ergocalciferol 50000 UNITS capsule  Commonly known as:  VITAMIN D2  Take 1 capsule (50,000 Units total) by mouth once a week.     folic acid 1 MG tablet  Commonly known as:  FOLVITE  Take 1 mg by mouth daily.     HYDROmorphone 2 MG tablet  Commonly known as:  DILAUDID  Take 2 mg by mouth every 4 (four) hours as needed for severe pain.     HYDROmorphone 2 MG tablet  Commonly known as:  DILAUDID  Take 1 tablet (2 mg total) by mouth every 4 (four) hours as needed for severe pain.     hydroxyurea 500 MG capsule  Commonly known as:  HYDREA  Take 2,000 mg by mouth daily. May take with food to minimize GI side effects.     l-methylfolate-B6-B12 3-35-2 MG Tabs tablet  Commonly known as:  METANX  Take 1 tablet by mouth daily.        Past Medical History  Diagnosis Date  . Sickle cell anemia (HCC)   . Migraine   .  CVA (cerebral infarction)    Immunization History  Administered Date(s) Administered  . HPV Quadrivalent 03/27/2015  . Influenza,inj,Quad PF,36+ Mos 03/12/2015   Review of Systems  Constitutional: Negative.  Negative for fever, fatigue and unexpected weight change.  HENT: Negative.   Eyes: Negative.   Respiratory: Negative.   Cardiovascular: Negative.   Gastrointestinal: Negative.   Endocrine: Negative.  Negative for polydipsia, polyphagia and polyuria.  Genitourinary: Negative for dysuria, frequency, hematuria, flank pain, discharge, enuresis, difficulty urinating and penile pain.       Scattered papules to penis  Musculoskeletal: Positive for myalgias (occasional related to sickle cell anemia).  Skin: Negative.   Allergic/Immunologic: Negative.   Neurological: Negative.   Hematological: Negative.   Psychiatric/Behavioral: Negative.        Objective:   Physical Exam  Constitutional: He is oriented to person, place, and time. He appears well-developed and well-nourished.  HENT:  Head: Normocephalic and atraumatic.  Right Ear: External ear normal.  Left Ear: External ear normal.  Mouth/Throat: Oropharynx is clear and moist.  Eyes: Conjunctivae and EOM are normal. Pupils are equal, round, and reactive to light.  Neck: Normal range of motion. Neck supple.  Cardiovascular: Normal rate, regular rhythm, normal heart sounds and intact distal pulses.   Pulmonary/Chest: Effort normal and breath sounds normal.  Abdominal: Soft. Bowel sounds are normal.  Genitourinary: Testes normal.    Circumcised.  Musculoskeletal: Normal range of motion.  Neurological: He is alert and oriented to person, place, and time. He has normal reflexes.  Skin: Skin is warm and dry. No rash noted.  Psychiatric: He has a normal mood and affect. His behavior is normal. Judgment and thought content normal.         BP 115/44 mmHg  Pulse 52  Temp(Src) 98.1 F (36.7 C) (Oral)  Resp 16  Ht 5\' 7"   (1.702 m)  Wt 158 lb (71.668 kg)  BMI 24.74 kg/m2 Assessment & Plan:  1. Hb-SS disease without crisis [facility]) Will send a referral to an adult hematologist. 1. Sickle cell disease - Continue Hydrea daily. We discussed the need for good hydration, monitoring of hydration status, avoidance of heat, cold, stress, and infection triggers. We discussed the risks and benefits of Hydrea, including bone marrow suppression, the possibility of GI upset, skin ulcers, hair thinning, and teratogenicity. The patient was reminded of the need to seek medical attention of any symptoms of bleeding, anemia, or infection.  Pulmonary evaluation - Patient denies severe recurrent wheezes, shortness of breath with exercise, or persistent cough. If these symptoms develop, pulmonary function tests with spirometry will be ordered, and if abnormal, plan on referral to Pulmonology for further evaluation.   Cardiac - Routine screening for pulmonary hypertension is not recommended.  Eye - High risk of proliferative retinopathy. Annual eye exam with retinal exam recommended to patient.  Immunization status - Will receive influenza vaccination today. Will check [facility] to inquire about HPV and meningiococcal vaccinations.   Acute and chronic painful episodes -  Opiate medications are prescribed by hematologist.  - Ambulatory referral to Hematology    2. Thrombocytosis (HCC) Platelet count elevated. Will start a trial of aspirin. Will re-check CBC w/ differential in 1 month.  - CBC with Differential; Future - aspirin EC 81 MG tablet; Take 1 tablet (81 mg total) by mouth daily.  Dispense: 30 tablet; Refill: 2 - Ambulatory referral to Hematology   3. Counseling for HPV (human papillomavirus) vaccination  - HPV vaccine quadravalent 3 dose IM  3. Penile lesion Papules are gray, smooth, and scattered in distrubution. Painless and non tender to palpation.   Patient may require biopsy for further evaluation. All STD testing  negative. Will send to dermatology for further evaluation.  - Ambulatory referral to Dermatology  RTC: Will follow up in 1 month for CBCw/diff    Dorena Dew, FNP

## 2015-04-01 ENCOUNTER — Telehealth: Payer: Self-pay

## 2015-04-01 NOTE — Telephone Encounter (Signed)
Spoke with patient regarding dermatology referral advised of appointment with [facility] for 04/13/2015 @11 :30am. Thanks!

## 2015-05-01 ENCOUNTER — Other Ambulatory Visit: Payer: BLUE CROSS/BLUE SHIELD

## 2015-12-29 ENCOUNTER — Other Ambulatory Visit: Payer: Self-pay | Admitting: Internal Medicine

## 2016-02-23 ENCOUNTER — Telehealth: Payer: Self-pay

## 2016-02-24 MED ORDER — HYDROXYUREA 500 MG PO CAPS: 2000.0000 mg | ORAL_CAPSULE | Freq: Every day | ORAL | 1 refills | Status: DC

## 2016-02-24 NOTE — Telephone Encounter (Signed)
Refilled Hydra until patients next appointment. Thanks!

## 2016-03-03 ENCOUNTER — Inpatient Hospital Stay (HOSPITAL_COMMUNITY)
Admission: EM | Admit: 2016-03-03 | Discharge: 2016-03-05 | DRG: 812 | Disposition: A | Discharge status: Home / Self Care | Payer: BLUE CROSS/BLUE SHIELD | Attending: Internal Medicine | Admitting: Internal Medicine

## 2016-03-03 ENCOUNTER — Encounter (HOSPITAL_COMMUNITY): Payer: Self-pay | Admitting: *Deleted

## 2016-03-03 ENCOUNTER — Emergency Department (HOSPITAL_COMMUNITY): Payer: BLUE CROSS/BLUE SHIELD

## 2016-03-03 DIAGNOSIS — G43909 Migraine, unspecified, not intractable, without status migrainosus: Secondary | ICD-10-CM | POA: Diagnosis present

## 2016-03-03 DIAGNOSIS — Z8673 Personal history of transient ischemic attack (TIA), and cerebral infarction without residual deficits: Secondary | ICD-10-CM

## 2016-03-03 DIAGNOSIS — Z885 Allergy status to narcotic agent status: Secondary | ICD-10-CM

## 2016-03-03 DIAGNOSIS — D5700 Hb-SS disease with crisis, unspecified: Secondary | ICD-10-CM | POA: Diagnosis not present

## 2016-03-03 DIAGNOSIS — Z7982 Long term (current) use of aspirin: Secondary | ICD-10-CM

## 2016-03-03 DIAGNOSIS — Z87891 Personal history of nicotine dependence: Secondary | ICD-10-CM | POA: Diagnosis not present

## 2016-03-03 DIAGNOSIS — Z23 Encounter for immunization: Secondary | ICD-10-CM | POA: Diagnosis not present

## 2016-03-03 DIAGNOSIS — Z79899 Other long term (current) drug therapy: Secondary | ICD-10-CM | POA: Diagnosis not present

## 2016-03-03 LAB — BASIC METABOLIC PANEL
Anion gap: 11 (ref 5–15)
BUN: 6 mg/dL (ref 6–20)
CALCIUM: 9.5 mg/dL (ref 8.9–10.3)
CHLORIDE: 97 mmol/L — AB (ref 101–111)
CO2: 25 mmol/L (ref 22–32)
CREATININE: 0.69 mg/dL (ref 0.61–1.24)
GFR calc Af Amer: >60 mL/min (ref >60)
GFR calc non Af Amer: >60 mL/min (ref >60)
GLUCOSE: 106 mg/dL — AB (ref 65–99)
Potassium: 3.7 mmol/L (ref 3.5–5.1)
Sodium: 133 mmol/L — ABNORMAL LOW (ref 135–145)

## 2016-03-03 LAB — CBC
HCT: 29.5 % — ABNORMAL LOW (ref 39.0–52.0)
Hemoglobin: 11.1 g/dL — ABNORMAL LOW (ref 13.0–17.0)
MCH: 38.9 pg — AB (ref 26.0–34.0)
MCHC: 37.6 g/dL — AB (ref 30.0–36.0)
MCV: 103.5 fL — AB (ref 78.0–100.0)
PLATELETS: 363 10*3/uL (ref 150–400)
RBC: 2.85 MIL/uL — ABNORMAL LOW (ref 4.22–5.81)
RDW: 18.4 % — ABNORMAL HIGH (ref 11.5–15.5)
WBC: 11.2 10*3/uL — ABNORMAL HIGH (ref 4.0–10.5)

## 2016-03-03 LAB — HEPATIC FUNCTION PANEL
ALBUMIN: 4.4 g/dL (ref 3.5–5.0)
ALT: 34 U/L (ref 17–63)
AST: 56 U/L — AB (ref 15–41)
Alkaline Phosphatase: 68 U/L (ref 38–126)
BILIRUBIN DIRECT: 0.3 mg/dL (ref 0.1–0.5)
BILIRUBIN TOTAL: 4.6 mg/dL — AB (ref 0.3–1.2)
Indirect Bilirubin: 4.3 mg/dL — ABNORMAL HIGH (ref 0.3–0.9)
Total Protein: 8.1 g/dL (ref 6.5–8.1)

## 2016-03-03 LAB — RETICULOCYTES: RBC.: 2.85 MIL/uL — AB (ref 4.22–5.81)

## 2016-03-03 LAB — I-STAT TROPONIN, ED: TROPONIN I, POC: 0.01 ng/mL (ref 0.00–0.08)

## 2016-03-03 MED ORDER — DEXTROSE-NACL 5-0.45 % IV SOLN
INTRAVENOUS | Status: DC
  Administered 2016-03-03 – 2016-03-04 (×4): via INTRAVENOUS

## 2016-03-03 MED ORDER — DIPHENHYDRAMINE HCL 50 MG/ML IJ SOLN
25.0000 mg | Freq: Once | INTRAMUSCULAR | Status: AC
  Administered 2016-03-03: 25 mg via INTRAVENOUS
  Filled 2016-03-03: qty 1

## 2016-03-03 MED ORDER — DIPHENHYDRAMINE HCL 25 MG PO CAPS: 25.0000 mg | ORAL_CAPSULE | ORAL | Status: DC | PRN

## 2016-03-03 MED ORDER — HYDROMORPHONE HCL 2 MG/ML IJ SOLN
2.0000 mg | INTRAMUSCULAR | Status: AC
  Filled 2016-03-03: qty 1

## 2016-03-03 MED ORDER — HYDROXYUREA 500 MG PO CAPS
2000.0000 mg | ORAL_CAPSULE | Freq: Every day | ORAL | Status: DC
  Administered 2016-03-03 – 2016-03-05 (×3): 2000 mg via ORAL
  Filled 2016-03-03 (×3): qty 4

## 2016-03-03 MED ORDER — HYDROMORPHONE HCL 2 MG/ML IJ SOLN
2.0000 mg | INTRAMUSCULAR | Status: AC
  Administered 2016-03-03: 2 mg via INTRAVENOUS

## 2016-03-03 MED ORDER — ASPIRIN EC 81 MG PO TBEC
81.0000 mg | DELAYED_RELEASE_TABLET | Freq: Every day | ORAL | Status: DC
  Administered 2016-03-03 – 2016-03-05 (×3): 81 mg via ORAL
  Filled 2016-03-03 (×3): qty 1

## 2016-03-03 MED ORDER — SODIUM CHLORIDE 0.9 % IV SOLN
25.0000 mg | INTRAVENOUS | Status: DC | PRN
  Filled 2016-03-03: qty 0.5

## 2016-03-03 MED ORDER — SENNOSIDES-DOCUSATE SODIUM 8.6-50 MG PO TABS
1.0000 | ORAL_TABLET | Freq: Two times a day (BID) | ORAL | Status: DC
  Administered 2016-03-03 – 2016-03-04 (×3): 1 via ORAL
  Filled 2016-03-03 (×3): qty 1

## 2016-03-03 MED ORDER — HYDROMORPHONE HCL 2 MG/ML IJ SOLN: 2.0000 mg | INTRAMUSCULAR | Status: DC

## 2016-03-03 MED ORDER — OXYCODONE HCL 5 MG PO TABS: 10.0000 mg | ORAL_TABLET | ORAL | Status: DC | PRN

## 2016-03-03 MED ORDER — SODIUM CHLORIDE 0.9% FLUSH: 3.0000 mL | INTRAVENOUS | Status: DC | PRN

## 2016-03-03 MED ORDER — HYDROMORPHONE HCL 2 MG/ML IJ SOLN: 2.0000 mg | INTRAMUSCULAR | Status: DC | PRN

## 2016-03-03 MED ORDER — SODIUM CHLORIDE 0.9 % IV SOLN: 250.0000 mL | INTRAVENOUS | Status: DC | PRN

## 2016-03-03 MED ORDER — L-METHYLFOLATE-B6-B12 3-35-2 MG PO TABS
1.0000 | ORAL_TABLET | Freq: Every day | ORAL | Status: DC
  Administered 2016-03-03 – 2016-03-05 (×3): 1 via ORAL
  Filled 2016-03-03 (×3): qty 1

## 2016-03-03 MED ORDER — SODIUM CHLORIDE 0.9% FLUSH
3.0000 mL | Freq: Two times a day (BID) | INTRAVENOUS | Status: DC
  Administered 2016-03-04 (×2): 3 mL via INTRAVENOUS

## 2016-03-03 MED ORDER — POLYETHYLENE GLYCOL 3350 17 G PO PACK: 17.0000 g | PACK | Freq: Every day | ORAL | Status: DC | PRN

## 2016-03-03 MED ORDER — KETOROLAC TROMETHAMINE 30 MG/ML IJ SOLN
15.0000 mg | Freq: Once | INTRAMUSCULAR | Status: AC
  Administered 2016-03-03: 15 mg via INTRAVENOUS
  Filled 2016-03-03: qty 1

## 2016-03-03 MED ORDER — SODIUM CHLORIDE 0.9 % IV SOLN
INTRAVENOUS | Status: DC
  Administered 2016-03-03: 23:00:00 via INTRAVENOUS

## 2016-03-03 MED ORDER — ENOXAPARIN SODIUM 40 MG/0.4ML ~~LOC~~ SOLN
40.0000 mg | SUBCUTANEOUS | Status: DC
  Filled 2016-03-03: qty 0.4

## 2016-03-03 MED ORDER — FOLIC ACID 1 MG PO TABS
1.0000 mg | ORAL_TABLET | Freq: Every day | ORAL | Status: DC
  Administered 2016-03-03 – 2016-03-05 (×3): 1 mg via ORAL
  Filled 2016-03-03 (×3): qty 1

## 2016-03-03 MED ORDER — ONDANSETRON HCL 4 MG/2ML IJ SOLN
4.0000 mg | INTRAMUSCULAR | Status: DC | PRN
  Administered 2016-03-03 – 2016-03-04 (×3): 4 mg via INTRAVENOUS
  Filled 2016-03-03 (×3): qty 2

## 2016-03-03 MED ORDER — HYDROMORPHONE HCL 2 MG/ML IJ SOLN
2.0000 mg | INTRAMUSCULAR | Status: DC
  Administered 2016-03-03: 2 mg via INTRAVENOUS
  Filled 2016-03-03: qty 1

## 2016-03-03 MED ORDER — HYDROMORPHONE HCL 2 MG/ML IJ SOLN
2.0000 mg | INTRAMUSCULAR | Status: DC | PRN
  Administered 2016-03-03 – 2016-03-04 (×5): 2 mg via INTRAVENOUS
  Filled 2016-03-03 (×5): qty 1

## 2016-03-03 NOTE — ED Notes (Signed)
Delay in lab draw,  Pt not in room 

## 2016-03-03 NOTE — ED Notes (Signed)
Pt would like to wait for the IV stick to have blood drawn, stating that he is a hard stick.  Given O2 per request.

## 2016-03-03 NOTE — ED Triage Notes (Signed)
Pt states states chest pain that radiates to back and abdominal pain since Monday.  Feels it's r/t his sickle cell pain.  Pt states he was out of hydroxyurea x 1 week.  Has been taking ibuprofen and dilaudid with minimal relief.

## 2016-03-03 NOTE — ED Provider Notes (Signed)
[facility] DEPT Provider Note   CSN: [id number] Arrival date & time: 03/03/16  1215     History   Chief Complaint Chief Complaint  Patient presents with  . Chest Pain  . Sickle Cell Pain Crisis    HPI [name] is a 20 y.o. male.  HPI 20 year old male with past medical history of hemoglobin SS sickle cell disease with history of CVA, acute chest, who presents with chest wall pain and abdominal pain. The patient states he ran out of his hydroxyurea approximate week ago. Over the last 3-4 days, he has developed aching, throbbing, bilateral chest wall and posterior chest wall tenderness. He describes it as an aching, throbbing sensation that is similar to his previous pain crises. Denies any fever, cough, sputum production, or shortness of breath. He also endorses generalized abdominal pain, which is also consistent with his previous crises. Denies any nausea, vomiting, diarrhea, or blood stools. He has been taking his home Dilaudid without significant improvement. He notes running out of hydroxyurea as well as weather changes as potential triggers.  Past Medical History:  Diagnosis Date  . CVA (cerebral infarction)   . Migraine   . Sickle cell anemia [facility])     Patient Active Problem List   Diagnosis Date Noted  . Sickle cell pain crisis ([place]) 03/03/2016  . Thrombocytosis ([place]) 03/27/2015  . Penile lesion 03/12/2015  . Vitamin D deficiency 03/10/2014  . Hb-SS disease without crisis ([place]) 01/27/2014    Past Surgical History:  Procedure Laterality Date  . CHOLECYSTECTOMY         Home Medications    Prior to Admission medications   Medication Sig Start Date End Date Taking? Authorizing Provider  aspirin EC 81 MG tablet Take 1 tablet (81 mg total) by mouth daily. Patient taking differently: Take 81 mg by mouth every 4 (four) hours as needed for mild pain.  03/27/15  Yes Dorena Dew, FNP  folic acid (FOLVITE) 1 MG tablet Take 1 mg by mouth daily.   Yes Historical  Provider, MD  HYDROmorphone (DILAUDID) 2 MG tablet Take 1 tablet (2 mg total) by mouth every 4 (four) hours as needed for severe pain. 07/24/13  Yes Tatyana Kirichenko, PA-C  hydroxyurea (HYDREA) 500 MG capsule Take 4 capsules (2,000 mg total) by mouth daily. May take with food to minimize GI side effects. 02/24/16  Yes Micheline Chapman, NP  ibuprofen (ADVIL,MOTRIN) 400 MG tablet Take 400 mg by mouth every 6 (six) hours as needed for moderate pain.   Yes Historical Provider, MD  multivitamin (METANX) 3-35-2 MG TABS tablet TAKE ONE TABLET BY MOUTH ONE TIME DAILY 12/29/15  Yes Micheline Chapman, NP  ergocalciferol (VITAMIN D2) 50000 UNITS capsule Take 1 capsule (50,000 Units total) by mouth once a week. Patient not taking: Reported on 03/03/2016 03/10/14   Leana Gamer, MD  HYDROmorphone (DILAUDID) 2 MG tablet Take 2 mg by mouth every 4 (four) hours as needed for severe pain.    Historical Provider, MD    Family History No family history on file.  Social History Social History  Substance Use Topics  . Smoking status: Former Research scientist (life sciences)  . Smokeless tobacco: Never Used  . Alcohol use No     Allergies   Morphine and related   Review of Systems Review of Systems  Constitutional: Positive for fatigue. Negative for chills and fever.  HENT: Negative for congestion and rhinorrhea.   Eyes: Negative for visual disturbance.  Respiratory: Negative for cough, chest  tightness, shortness of breath and wheezing.   Cardiovascular: Positive for chest pain. Negative for leg swelling.  Gastrointestinal: Positive for abdominal pain. Negative for diarrhea, nausea and vomiting.  Genitourinary: Negative for dysuria and flank pain.  Musculoskeletal: Negative for neck pain and neck stiffness.  Skin: Negative for rash and wound.  Allergic/Immunologic: Negative for immunocompromised state.  Neurological: Negative for syncope, weakness and headaches.  All other systems reviewed and are  negative.    Physical Exam Updated Vital Signs BP (!) 101/29 (BP Location: Left Arm)   Pulse (!) 55   Temp 98 F (36.7 C) (Oral)   Resp 17   Ht 5\' 7"  (1.702 m)   Wt 168 lb 6.9 oz (76.4 kg)   SpO2 100%   BMI 26.38 kg/m   Physical Exam  Constitutional: He is oriented to person, place, and time. He appears well-developed and well-nourished. No distress.  HENT:  Head: Normocephalic and atraumatic.  Eyes: Conjunctivae are normal.  Neck: Neck supple.  Cardiovascular: Normal rate, regular rhythm and normal heart sounds.  Exam reveals no friction rub.   No murmur heard. Pulmonary/Chest: Effort normal and breath sounds normal. No respiratory distress. He has no wheezes. He has no rales.  Normal work of breathing. Mild tenderness to palpation of her lateral chest wall and posterior chest wall with no bruising or deformity.  Abdominal: Soft. Bowel sounds are normal. He exhibits no distension. There is no rebound and no guarding.  Musculoskeletal: He exhibits no edema.  Neurological: He is alert and oriented to person, place, and time. He exhibits normal muscle tone.  Skin: Skin is warm. Capillary refill takes less than 2 seconds.  Psychiatric: He has a normal mood and affect.  Nursing note and vitals reviewed.    ED Treatments / Results  Labs (all labs ordered are listed, but only abnormal results are displayed) Labs Reviewed  BASIC METABOLIC PANEL - Abnormal; Notable for the following:       Result Value   Sodium 133 (*)    Chloride 97 (*)    Glucose, Bld 106 (*)    All other components within normal limits  CBC - Abnormal; Notable for the following:    WBC 11.2 (*)    RBC 2.85 (*)    Hemoglobin 11.1 (*)    HCT 29.5 (*)    MCV 103.5 (*)    MCH 38.9 (*)    MCHC 37.6 (*)    RDW 18.4 (*)    All other components within normal limits  RETICULOCYTES - Abnormal; Notable for the following:    RBC. 2.85 (*)    All other components within normal limits  HEPATIC FUNCTION PANEL -  Abnormal; Notable for the following:    AST 56 (*)    Total Bilirubin 4.6 (*)    Indirect Bilirubin 4.3 (*)    All other components within normal limits  COMPREHENSIVE METABOLIC PANEL  CBC  RETICULOCYTES  I-STAT TROPOININ, ED    EKG  EKG Interpretation  Date/Time:  Thursday March 03 2016 13:05:33 EST Ventricular Rate:  70 PR Interval:  160 QRS Duration: 82 QT Interval:  360 QTC Calculation: 388 R Axis:   98 Text Interpretation:  Normal sinus rhythm Rightward axis Non-specific ST segment changes, with TWI in lead III No old tracing to compare Confirmed by Gwendolynn Merkey MD, Teniya Filter [phone]) on 03/03/2016 3:20:48 PM       Radiology Dg Chest 2 View  Result Date: 03/03/2016 CLINICAL DATA:  Chest, rib and back  pain in a patient with sickle cell disease. EXAM: CHEST  2 VIEW COMPARISON:  None. FINDINGS: The lungs are clear. Heart size is normal. No pneumothorax or pleural effusion. No bony abnormality is identified. Cholecystectomy clips are noted. IMPRESSION: Negative chest. Electronically Signed   By: Inge Rise M.D.   On: 03/03/2016 13:33    Procedures Procedures (including critical care time)  Medications Ordered in ED Medications  dextrose 5 %-0.45 % sodium chloride infusion ( Intravenous New Bag/Given 03/03/16 2338)  ondansetron (ZOFRAN) injection 4 mg (4 mg Intravenous Given 03/03/16 1603)  aspirin EC tablet 81 mg (81 mg Oral Given 03/03/16 2240)  hydroxyurea (HYDREA) capsule 2,000 mg (2,000 mg Oral Given 03/03/16 2236)  l-methylfolate-B6-B12 (METANX) 3-35-2 MG per tablet 1 tablet (1 tablet Oral Given Q000111Q Q000111Q)  folic acid (FOLVITE) tablet 1 mg (1 mg Oral Given 03/03/16 2236)  senna-docusate (Senokot-S) tablet 1 tablet (1 tablet Oral Given 03/03/16 2236)  polyethylene glycol (MIRALAX / GLYCOLAX) packet 17 g (not administered)  enoxaparin (LOVENOX) injection 40 mg (40 mg Subcutaneous Not Given 03/03/16 2240)  sodium chloride flush (NS) 0.9 % injection 3 mL (3 mLs Intravenous  Not Given 03/03/16 2200)  sodium chloride flush (NS) 0.9 % injection 3 mL (not administered)  0.9 %  sodium chloride infusion (not administered)  diphenhydrAMINE (BENADRYL) capsule 25-50 mg (not administered)    Or  diphenhydrAMINE (BENADRYL) 25 mg in sodium chloride 0.9 % 50 mL IVPB (not administered)  oxyCODONE (Oxy IR/ROXICODONE) immediate release tablet 10-15 mg (not administered)  HYDROmorphone (DILAUDID) injection 2 mg (2 mg Intravenous Given 03/03/16 2338)  HYDROmorphone (DILAUDID) injection 2 mg (2 mg Intravenous Given 03/03/16 1608)    Or  HYDROmorphone (DILAUDID) injection 2 mg ( Subcutaneous See Alternative 03/03/16 1608)  diphenhydrAMINE (BENADRYL) injection 25 mg (25 mg Intravenous Given 03/03/16 1603)  ketorolac (TORADOL) 30 MG/ML injection 15 mg (15 mg Intravenous Given 03/03/16 1753)     Initial Impression / Assessment and Plan / ED Course  I have reviewed the triage vital signs and the nursing notes.  Pertinent labs & imaging results that were available during my care of the patient were reviewed by me and considered in my medical decision making (see chart for details).  Clinical Course     20 year old male with history of sickle cell disease here with chest and abdominal pain. See history of present illness above. On arrival, vital signs are stable and within normal limits. He has no hypoxia but is requesting oxygen for comfort. Labwork shows mild leukocytosis and sickle cells but hemoglobin is at baseline. BMP at baseline. Bilirubin elevated at 4.6. EKG is nonischemic and chest x-ray shows no acute abnormality. Suspect acute pain crisis, likely in setting of running out of hydroxyurea as well as weather changes. He has no hypoxia, cough, shortness of breath, infiltrate on chest injury, or other evidence to suggest acute chest syndrome. Patient has been given IV fluids and pain medicine here and has persistent pain. Discussed outpatient versus inpatient management and patient is  hesitant to return home. Will admit to the hospital for further management and observation. Initial troponin is negative and EKG is nonischemic.    Final Clinical Impressions(s) / ED Diagnoses   Final diagnoses:  Sickle cell pain crisis ([place])     Duffy Bruce, MD 03/04/16 [phone]

## 2016-03-03 NOTE — Progress Notes (Signed)
Patient admitted to room 6N06 from ED with sickle cell pain crisis. Alert and oriented x4. Oriented to room and call bell. Denies pain at this time.

## 2016-03-03 NOTE — H&P (Signed)
History and Physical   Ryan Ryan Fowler [id number] DOB: [birth year] DOA: 03/03/2016  PCP: Ryan Dew, FNP  Chief Complaint: Rib Pain  HPI:  [name] is a 20 year old African-American male with sickle cell disease (HbSS). He recently ran out of his hydroxyurea and presented to the emergency department on 11/9 complaining of rib pain. He states that pain in his ribs and chest is common for a typical sickle cell crisis. He states this is his first hospitalization for sickle cell pain crisis in 2017. He generally takes ibuprofen, Tylenol, and occasionally Dilaudid orally for pain in the outpatient setting. The pain started several days ago. No alleviating or exacerbating factors for the pain. No radiation of the pain. No recent upper viral respiratory symptoms. No further fever chills at home. Patient denies any pain in the long bones or abdomen. Patient states he was otherwise in his normal state of health prior to acute onset of chest and rib pain.  ED Course:  Patient was hemodynamically stable in the emergency department. Heart rate was 72 on arrival, and blood pressure was 118/57. Patient was saturating 96% on room air. Lab work revealed a mild leukocytosis of 11.2, hemoglobin 11.1, platelet count 363. Total bilirubin slightly elevated at 4.6. Chest x-ray was negative for infection. Patient was given IV fluids and Dilaudid for pain. The Dilaudid was helpful in alleviating his pain, the patient did not fill culture going home given persistence of the pain.  Review of Systems: A complete ROS was obtained; pertinent positives negatives are denoted in the HPI. Otherwise, all systems are negative.   Past Medical History:  Diagnosis Date  . CVA (cerebral infarction)   . Migraine   . Sickle cell anemia ([facility])    Social History   Social History  . Marital status: Single    Spouse name: N/A  . Number of children: N/A  . Years of education: N/A   Occupational History  . Not on file.    Social History Main Topics  . Smoking status: Former Research scientist (life sciences)  . Smokeless tobacco: Never Used  . Alcohol use No  . Drug use: No  . Sexual activity: Not on file   Other Topics Concern  . Not on file   Social History Narrative  . No narrative on file   Patient is a current college student at [employer]. Pt denies any alcohol or drug use. Patient does not smoke. He is Public affairs consultant.  Family Hx: Family history is not pertinent to patient presentation. Mother denies having sickle cell disease.   Physical Exam: Vitals:   03/03/16 1800 03/03/16 1815 03/03/16 1830 03/03/16 1845  BP: (!) 107/47 103/57 105/55 100/56  Pulse: (!) 55 60 (!) 50 (!) 56  Resp: 13 16 13 20   Temp:      TempSrc:      SpO2: 98% 98% 98% 97%  Weight:       General: Appears moderately uncomfortable. Thin and muscular habitus. ENT: Grossly normal hearing, MMM. Cardiovascular: RRR. No M/R/G. No LE edema.  Respiratory: CTA bilaterally. No wheezes or crackles. Normal respiratory effort. Abdomen: Soft, non-tender. Bowel sounds present.  Skin: No rash or induration seen on limited exam. Musculoskeletal: Sternum is mildly tender to palpation.  Psychiatric: Grossly normal mood and affect. Neurologic: Moves all extremities in coordinated fashion.  I have personally reviewed the following labs, culture data, and imaging studies.  Labs:  Lab work obtained in the emergency department shows mild leukocytosis of 11.2, hemoglobin 11.1, platelet count 363. Initial troponin  negative. Reticulocytes could not be calculated initially. Patient has normal renal function with BUNs of 6, and creatinine 0.69. Total bilirubin elevated at 4.6.   Radiology:  Chest x-ray negative for infection.   Assessment/Plan: Mr. Ryan Ryan Fowler is a 20 year old African-American male with sickle cell disease (HbSS). He recently ran out of his hydroxyurea and presented to the emergency department on 11/9 complaining of rib pain.   #1 Acute sickle cell pain  crisis (vaso-occlusive) Patient is currently hemodynamically stable. There are no signs or symptoms of acute chest syndrome. He appears well on physical exam, but is currently clearly uncomfortable overall, his sickle cell disease is well-controlled and he infrequently requires hospitalization. This recent vaso-occlusive crisis may have been precipitated by nonadherence to hydroxyurea, as heran out of this medication at home. I highly doubt underlying infection. Patient has already improved with oxygen and IV narcotics. Plan: -Continue both oral and IV narcotics for breakthrough pain  -Scheduled bowel regimen to prevent constipation  -Restart home hydroxyurea -Oxygen via nasal cannula  -Aggressive IV hydration with normal saline   -Trend bilirubin, reticulocyte count, and hemoglobin with daily labs  DVT prophylaxis: Subq Lovenox Code Status: Full Code Disposition Plan: Anticipate D/C home in 2-5 days Consults called: N/A Admission status: Inpatient  Ryan Laming, MD [facility] Page:[phone]  If 7PM-7AM, please contact night-coverage [email or web address] Password [email or web address]

## 2016-03-04 LAB — CBC WITH DIFFERENTIAL/PLATELET
BASOS PCT: 0 %
Basophils Absolute: 0 10*3/uL (ref 0.0–0.1)
EOS ABS: 0 10*3/uL (ref 0.0–0.7)
EOS PCT: 0 %
HEMATOCRIT: 24.5 % — AB (ref 39.0–52.0)
Hemoglobin: 9.3 g/dL — ABNORMAL LOW (ref 13.0–17.0)
LYMPHS ABS: 1.9 10*3/uL (ref 0.7–4.0)
Lymphocytes Relative: 20 %
MCH: 39.1 pg — AB (ref 26.0–34.0)
MCHC: 38 g/dL — AB (ref 30.0–36.0)
MCV: 102.9 fL — AB (ref 78.0–100.0)
MONO ABS: 0.7 10*3/uL (ref 0.1–1.0)
Monocytes Relative: 8 %
NEUTROS PCT: 72 %
Neutro Abs: 6.7 10*3/uL (ref 1.7–7.7)
PLATELETS: 398 10*3/uL (ref 150–400)
RBC: 2.38 MIL/uL — ABNORMAL LOW (ref 4.22–5.81)
RDW: 18.7 % — AB (ref 11.5–15.5)
WBC: 9.3 10*3/uL (ref 4.0–10.5)

## 2016-03-04 LAB — CBC
HCT: 25.9 % — ABNORMAL LOW (ref 39.0–52.0)
HEMOGLOBIN: 9.6 g/dL — AB (ref 13.0–17.0)
MCH: 38.6 pg — ABNORMAL HIGH (ref 26.0–34.0)
MCHC: 37.1 g/dL — AB (ref 30.0–36.0)
MCV: 104 fL — ABNORMAL HIGH (ref 78.0–100.0)
PLATELETS: 337 10*3/uL (ref 150–400)
RBC: 2.49 MIL/uL — AB (ref 4.22–5.81)
RDW: 17.6 % — ABNORMAL HIGH (ref 11.5–15.5)
WBC: 8.9 10*3/uL (ref 4.0–10.5)

## 2016-03-04 LAB — COMPREHENSIVE METABOLIC PANEL
ALK PHOS: 62 U/L (ref 38–126)
ALT: 31 U/L (ref 17–63)
ANION GAP: 7 (ref 5–15)
AST: 38 U/L (ref 15–41)
Albumin: 3.8 g/dL (ref 3.5–5.0)
BILIRUBIN TOTAL: 2.9 mg/dL — AB (ref 0.3–1.2)
BUN: 6 mg/dL (ref 6–20)
CALCIUM: 8.7 mg/dL — AB (ref 8.9–10.3)
CO2: 29 mmol/L (ref 22–32)
Chloride: 98 mmol/L — ABNORMAL LOW (ref 101–111)
Creatinine, Ser: 0.74 mg/dL (ref 0.61–1.24)
GFR calc non Af Amer: >60 mL/min (ref >60)
GLUCOSE: 96 mg/dL (ref 65–99)
Potassium: 3.7 mmol/L (ref 3.5–5.1)
Sodium: 134 mmol/L — ABNORMAL LOW (ref 135–145)
TOTAL PROTEIN: 7 g/dL (ref 6.5–8.1)

## 2016-03-04 LAB — URINALYSIS, ROUTINE W REFLEX MICROSCOPIC
BILIRUBIN URINE: NEGATIVE
Glucose, UA: NEGATIVE mg/dL
HGB URINE DIPSTICK: NEGATIVE
KETONES UR: NEGATIVE mg/dL
Leukocytes, UA: NEGATIVE
NITRITE: NEGATIVE
PH: 6 (ref 5.0–8.0)
Protein, ur: NEGATIVE mg/dL
Specific Gravity, Urine: 1.007 (ref 1.005–1.030)

## 2016-03-04 LAB — RETICULOCYTES
RBC.: 2.49 MIL/uL — AB (ref 4.22–5.81)
RETIC CT PCT: 11 % — AB (ref 0.4–3.1)
Retic Count, Absolute: 273.9 10*3/uL — ABNORMAL HIGH (ref 19.0–186.0)

## 2016-03-04 MED ORDER — HYDROMORPHONE HCL 4 MG PO TABS: 4.0000 mg | ORAL_TABLET | ORAL | Status: DC

## 2016-03-04 MED ORDER — KETOROLAC TROMETHAMINE 30 MG/ML IJ SOLN
30.0000 mg | Freq: Four times a day (QID) | INTRAMUSCULAR | Status: DC
  Administered 2016-03-04 – 2016-03-05 (×4): 30 mg via INTRAVENOUS
  Filled 2016-03-04 (×4): qty 1

## 2016-03-04 MED ORDER — HYDROMORPHONE HCL 4 MG PO TABS
4.0000 mg | ORAL_TABLET | Freq: Four times a day (QID) | ORAL | Status: DC
  Administered 2016-03-05: 4 mg via ORAL

## 2016-03-04 MED ORDER — SODIUM CHLORIDE 0.9 % IV BOLUS (SEPSIS)
3000.0000 mL | Freq: Once | INTRAVENOUS | Status: AC
  Administered 2016-03-04: 3000 mL via INTRAVENOUS

## 2016-03-04 MED ORDER — ASPIRIN-ACETAMINOPHEN-CAFFEINE 250-250-65 MG PO TABS
1.0000 | ORAL_TABLET | Freq: Three times a day (TID) | ORAL | Status: DC | PRN
  Administered 2016-03-04 – 2016-03-05 (×2): 1 via ORAL
  Filled 2016-03-04 (×3): qty 1

## 2016-03-04 MED ORDER — ONDANSETRON HCL 4 MG/2ML IJ SOLN: 4.0000 mg | Freq: Four times a day (QID) | INTRAMUSCULAR | Status: DC | PRN

## 2016-03-04 MED ORDER — HYDROMORPHONE HCL 1 MG/ML IJ SOLN: 0.5000 mg | INTRAMUSCULAR | Status: DC | PRN

## 2016-03-04 MED ORDER — NALOXONE HCL 0.4 MG/ML IJ SOLN: 0.4000 mg | INTRAMUSCULAR | Status: DC | PRN

## 2016-03-04 MED ORDER — HYDROMORPHONE 1 MG/ML IV SOLN: INTRAVENOUS | Status: DC

## 2016-03-04 MED ORDER — SODIUM CHLORIDE 0.9% FLUSH: 9.0000 mL | INTRAVENOUS | Status: DC | PRN

## 2016-03-04 MED ORDER — INFLUENZA VAC SPLIT QUAD 0.5 ML IM SUSY
0.5000 mL | PREFILLED_SYRINGE | Freq: Once | INTRAMUSCULAR | Status: AC
  Administered 2016-03-05: 0.5 mL via INTRAMUSCULAR
  Filled 2016-03-04: qty 0.5

## 2016-03-04 MED ORDER — HYDROMORPHONE HCL 4 MG PO TABS
4.0000 mg | ORAL_TABLET | ORAL | Status: DC | PRN
  Administered 2016-03-04: 4 mg via ORAL
  Filled 2016-03-04: qty 1

## 2016-03-04 MED ORDER — PNEUMOCOCCAL VAC POLYVALENT 25 MCG/0.5ML IJ INJ
0.5000 mL | INJECTION | INTRAMUSCULAR | Status: AC
  Administered 2016-03-05: 0.5 mL via INTRAMUSCULAR
  Filled 2016-03-04 (×2): qty 0.5

## 2016-03-04 NOTE — Progress Notes (Signed)
Report called to [facility]. No questions/complaints from accepting RN.

## 2016-03-04 NOTE — Progress Notes (Signed)
[facility] here to transport pt. Pt. Transported to [facility] successfully with [facility].

## 2016-03-04 NOTE — Progress Notes (Signed)
Patient ID: Ryan Fowler, male   DOB: 09-01-1995, 20 y.o.   MRN: [id number] Opiate naive patient with Hb SS was admitted last night with Sickle Cell Crisis. He rarely uses opiates and rely's mostly on Ibuprofen for management of pain. Currently he rates his pain as 6/10 which came down to 3/10 after receiving Dilaudid 2 mg IVP. He has not received Toradol since 1753 yesterday afternoon. He is also c/o urinary retention.  I have discussed options for managing his pain. In light of his opiate naive state, I will give a dose of Toradol and oral Dilaudid and evaluate effect. If pain still persisting, will change to PCA. For his migraine, I have ordered Excedrin Migraine which is what he takes at home for the pain. Continue IVF.   Will also bladder scan patient and send urine for U/A.   Parents present in the room and all of the above discussed with them.   MATTHEWS,MICHELLE A.

## 2016-03-04 NOTE — Progress Notes (Addendum)
Received pt to room 1430 via [facility] transport, VS obtained, telemetry placed, oriented to unit, call light placed in reach, agree with previous assessment, no skin issues identified

## 2016-03-04 NOTE — Progress Notes (Signed)
Patient ID: Ryan Fowler, male   DOB: Dec 01, 1995, 20 y.o.   MRN: [id number]   Spoke with nurse Laural Benes and she reported that patient urinated 900 ml spontaneously and reported that his pain is down to 1/10 at present. Will continue Toradol, Dilaudid 4 mg orally every 6 hours and add Dilaudid 0.5 mg on a PRN basis.  [name]

## 2016-03-04 NOTE — Progress Notes (Signed)
Pt transferring to room 1331, report given to Maudie Mercury, Therapist, sports. Pt and his family are aware of transfer

## 2016-03-04 NOTE — Progress Notes (Signed)
[facility]                                                                              Patient Demographics  Ryan Fowler, is a 20 y.o. male, DOB - 1995-07-03, [id number]  Admit date - 03/03/2016   Admitting Physician Rexanne Mano, MD  Outpatient Primary MD for the patient is Dorena Dew, FNP  Outpatient specialists:   LOS - 1  days    Chief Complaint  Patient presents with  . Chest Pain  . Sickle Cell Pain Crisis       Brief summary   Ryan Fowler is a 20 year old African-American male with sickle cell disease (HbSS). He recently ran out of his hydroxyurea and presented to the emergency department on 11/9 complaining of rib pain. He states that pain in his ribs and chest is common for a typical sickle cell crisis. He states this is his first hospitalization for sickle cell pain crisis in 2017. He generally takes ibuprofen, Tylenol, and occasionally Dilaudid orally for pain in the outpatient setting. The pain started several days ago. No alleviating or exacerbating factors for the pain. No radiation of the pain. No recent upper viral respiratory symptoms. No further fever chills at home. Patient denies any pain in the long bones or abdomen. Patient states he was otherwise in his normal state of health prior to acute onset of chest and rib pain. ED Course:  Patient was hemodynamically stable in the emergency department. Heart rate was 72 on arrival, and blood pressure was 118/57. Patient was saturating 96% on room air. Lab work revealed a mild leukocytosis of 11.2, hemoglobin 11.1, platelet count 363. Total bilirubin slightly elevated at 4.6. Chest x-ray was negative for infection. Patient was given IV fluids and Dilaudid for pain And admitted for further management.   Assessment & Plan     Acute sickle cell Vaso-occlusive pain crisis - recently ran out of hydroxyurea, presented with rib pains - Currently hemodynamically stable. No signs or symptoms of  acute chest syndrome.  - Placed on aggressive IV fluid boluses, 3 L and then continue D5 half normal at 150 mL/ hour - will continue O2, IV Dilaudid 2 mg every 2 hours as needed for pain control  - Scheduled bowel regimen to prevent constipation  -Restart home hydroxyurea - Trend bilirubin, reticulocyte count, and hemoglobin with daily labs  Discussed in detail with patient and his mother at the bedside. Patient follows Dr. Liston Alba in sickle cell clinic at [facility]. I have discussed in detail with Dr. Liston Alba who will be accepting physician at [facility] sickle cell center. Will transfer patient to [facility]. Patient is agreeable with the plan.   Code Status: full code  DVT Prophylaxis:  Lovenox  Family Communication: Discussed in detail with the patient, all imaging results, lab results explained to the patient and mother at bed side  Disposition Plan: Transfer to [facility]  Time Spent in minutes  25 minutes  Procedures:  None   Consultants:   None   Antimicrobials:   None    Medications  Scheduled Meds: . aspirin  EC  81 mg Oral Daily  . enoxaparin (LOVENOX) injection  40 mg Subcutaneous Q24H  . folic acid  1 mg Oral Daily  . hydroxyurea  2,000 mg Oral Daily  . l-methylfolate-B6-B12  1 tablet Oral Daily  . senna-docusate  1 tablet Oral BID  . sodium chloride  3,000 mL Intravenous Once  . sodium chloride flush  3 mL Intravenous Q12H   Continuous Infusions: . dextrose 5 % and 0.45% NaCl 125 mL/hr at 03/04/16 0634   PRN Meds:.sodium chloride, diphenhydrAMINE **OR** diphenhydrAMINE (BENADRYL) IVPB(SICKLE CELL ONLY), HYDROmorphone (DILAUDID) injection, ondansetron, oxyCODONE, polyethylene glycol, sodium chloride flush   Antibiotics   Anti-infectives    None        Subjective:   Ryan Fowler was seen and examined today.  Pain improving at the time of my encounter, 2-3 out of 10. Patient denies dizziness, chest  pain, abdominal pain, N/V/D/C, new weakness, numbess, tingling.    Objective:   Vitals:   03/03/16 1930 03/03/16 2120 03/04/16 0214 03/04/16 0500  BP: (!) 108/47 (!) 101/29 (!) 117/42 (!) 128/46  Pulse: (!) 57 (!) 55 (!) 59 76  Resp: 18 17 17 18   Temp:  98 F (36.7 C) 98.2 F (36.8 C) (!) 96.6 F (35.9 C)  TempSrc:  Oral Oral Oral  SpO2: 99% 100% 100% 99%  Weight:  76.4 kg (168 lb 6.9 oz)    Height:  5\' 7"  (1.702 m)      Intake/Output Summary (Last 24 hours) at 03/04/16 0724 Last data filed at 03/04/16 0515  Gross per 24 hour  Intake              650 ml  Output             1000 ml  Net             -350 ml     Wt Readings from Last 3 Encounters:  03/03/16 76.4 kg (168 lb 6.9 oz)  03/27/15 71.7 kg (158 lb)  03/12/15 72.1 kg (159 lb)     Exam  General: Alert and oriented x 3, NAD  HEENT:  PERRLA, EOMI, Anicteric Sclera, mucous membranes moist.   Neck: Supple, no JVD, no masses  Cardiovascular: S1 S2 auscultated, no rubs, murmurs or gallops. Regular rate and rhythm.  Respiratory: Clear to auscultation bilaterally, no wheezing, rales or rhonchi  Gastrointestinal: Soft, nontender, nondistended, + bowel sounds  Ext: no cyanosis clubbing or edema  Neuro: AAOx3, Cr N's II- XII. Strength 5/5 upper and lower extremities bilaterally  Skin: No rashes  Psych: Normal affect and demeanor, alert and oriented x3    Data Reviewed:  I have personally reviewed following labs and imaging studies  Micro Results No results found for this or any previous visit (from the past 240 hour(s)).  Radiology Reports Dg Chest 2 View  Result Date: 03/03/2016 CLINICAL DATA:  Chest, rib and back pain in a patient with sickle cell disease. EXAM: CHEST  2 VIEW COMPARISON:  None. FINDINGS: The lungs are clear. Heart size is normal. No pneumothorax or pleural effusion. No bony abnormality is identified. Cholecystectomy clips are noted. IMPRESSION: Negative chest. Electronically Signed   By:  Inge Rise M.D.   On: 03/03/2016 13:33    Lab Data:  CBC:  Recent Labs Lab 03/03/16 1548 03/04/16 0338  WBC 11.2* 8.9  HGB 11.1* 9.6*  HCT 29.5* 25.9*  MCV 103.5* 104.0*  PLT 363 [id number]   Basic Metabolic Panel:  Recent Labs Lab  03/03/16 1548 03/04/16 0338  NA 133* 134*  K 3.7 3.7  CL 97* 98*  CO2 25 29  GLUCOSE 106* 96  BUN 6 6  CREATININE 0.69 0.74  CALCIUM 9.5 8.7*   GFR: Estimated Creatinine Clearance: 136.6 mL/min (by C-G formula based on SCr of 0.74 mg/dL). Liver Function Tests:  Recent Labs Lab 03/03/16 1732 03/04/16 0338  AST 56* 38  ALT 34 31  ALKPHOS 68 62  BILITOT 4.6* 2.9*  PROT 8.1 7.0  ALBUMIN 4.4 3.8   No results for input(s): LIPASE, AMYLASE in the last 168 hours. No results for input(s): AMMONIA in the last 168 hours. Coagulation Profile: No results for input(s): INR, PROTIME in the last 168 hours. Cardiac Enzymes: No results for input(s): CKTOTAL, CKMB, CKMBINDEX, TROPONINI in the last 168 hours. BNP (last 3 results) No results for input(s): PROBNP in the last 8760 hours. HbA1C: No results for input(s): HGBA1C in the last 72 hours. CBG: No results for input(s): GLUCAP in the last 168 hours. Lipid Profile: No results for input(s): CHOL, HDL, LDLCALC, TRIG, CHOLHDL, LDLDIRECT in the last 72 hours. Thyroid Function Tests: No results for input(s): TSH, T4TOTAL, FREET4, T3FREE, THYROIDAB in the last 72 hours. Anemia Panel:  Recent Labs  03/03/16 1548 03/04/16 0338  RETICCTPCT RESULTS UNAVAILABLE DUE TO INTERFERING SUBSTANCE 11.0*   Urine analysis:    Component Value Date/Time   COLORURINE YELLOW 06/12/2014 [place] 06/12/2014 1526   LABSPEC 1.012 06/12/2014 1526   PHURINE 7.0 06/12/2014 1526   GLUCOSEU NEG 06/12/2014 1526   HGBUR NEG 06/12/2014 1526   BILIRUBINUR NEG 06/12/2014 1526   KETONESUR NEG 06/12/2014 1526   PROTEINUR NEG 06/12/2014 1526   UROBILINOGEN 1 06/12/2014 1526   NITRITE NEG 06/12/2014  1526   LEUKOCYTESUR NEG 06/12/2014 1526     Ryan Fowler M.D. [facility] 03/04/2016, 7:24 AM  Pager: [phone] Between 7am to 7pm - call Pager - [phone]  After 7pm go to [email or web address] - password [email or web address]  Call night coverage person covering after 7pm

## 2016-03-05 DIAGNOSIS — D5700 Hb-SS disease with crisis, unspecified: Principal | ICD-10-CM

## 2016-03-05 MED ORDER — GUAIFENESIN-DM 100-10 MG/5ML PO SYRP: 10.0000 mL | ORAL_SOLUTION | Freq: Four times a day (QID) | ORAL | 0 refills | Status: DC | PRN

## 2016-03-05 MED ORDER — HYDROXYUREA 500 MG PO CAPS
2000.0000 mg | ORAL_CAPSULE | Freq: Every day | ORAL | 1 refills | Status: DC
Start: 2016-03-05 — End: 2016-06-15

## 2016-03-05 MED ORDER — AZITHROMYCIN 500 MG PO TABS: 500.0000 mg | ORAL_TABLET | Freq: Every day | ORAL | 0 refills | Status: AC

## 2016-03-05 MED ORDER — AZITHROMYCIN 250 MG PO TABS
500.0000 mg | ORAL_TABLET | Freq: Once | ORAL | Status: AC
  Administered 2016-03-05: 500 mg via ORAL
  Filled 2016-03-05: qty 2

## 2016-03-05 MED ORDER — GUAIFENESIN-DM 100-10 MG/5ML PO SYRP: 10.0000 mL | ORAL_SOLUTION | ORAL | Status: DC | PRN

## 2016-03-05 NOTE — Progress Notes (Signed)
PHARMACIST - PHYSICIAN COMMUNICATION  DR:   Doreene Burke CONCERNING: IV to Oral Route Change Policy  RECOMMENDATION: This patient is receiving diphenhydramine by the intravenous route.  Based on criteria approved by the Pharmacy and Therapeutics Committee, intravenous diphenhydramine is being converted to the equivalent oral dose form(s).   DESCRIPTION: These criteria include:  Diphenhydramine is not prescribed to treat or prevent a severe allergic reaction  Diphenhydramine is not prescribed as premedication prior to receiving blood product, biologic medication, antimicrobial, or chemotherapy agent  The patient has tolerated at least one dose of an oral or enteral medication  The patient has no evidence of active gastrointestinal bleeding or impaired GI absorption (gastrectomy, short bowel, patient on TNA or NPO).  The patient is not undergoing procedural sedation   If you have questions about this conversion, please contact the Pharmacy Department  []   [phone] )  Ryan Fowler []   [phone] )  Ryan Fowler []   [phone] )  Ryan Fowler []   [phone] )  Ryan Fowler []   [phone] )  Ryan Fowler

## 2016-03-05 NOTE — Discharge Summary (Signed)
Physician Discharge Summary  Ryan Fowler [id number] DOB: 08-27-1995 DOA: 03/03/2016  PCP: Ryan Dew, FNP  Admit date: 03/03/2016  Discharge date: 03/05/2016  Discharge Diagnoses:  Active Problems:   Sickle cell pain crisis [facility])  Discharge Condition: Stable  Disposition:  Follow-up Information    Ryan M, FNP Follow up in 2 week(s).   Specialty:  Family Medicine Contact information: [address] [place] [address] [phone]          Diet: Regular Wt Readings from Last 3 Encounters:  03/04/16 76.8 kg (169 lb 5 oz)  03/27/15 71.7 kg (158 lb)  03/12/15 72.1 kg (159 lb)   History of present illness:  [name] is a 20 year old African-American male with sickle cell disease (HbSS). He recently ran out of his hydroxyurea and presented to the emergency department on 11/9 complaining of rib pain. He states that pain in his ribs and chest is common for a typical sickle cell crisis. He states this is his first hospitalization for sickle cell pain crisis in 2017. He generally takes ibuprofen, Tylenol, and occasionally Dilaudid orally for pain in the outpatient setting. The pain started several days ago. No alleviating or exacerbating factors for the pain. No radiation of the pain. No recent upper viral respiratory symptoms. No further fever chills at home. Patient denies any pain in the long bones or abdomen. Patient states he was otherwise in his normal state of health prior to acute onset of chest and rib pain.  ED Course:  Patient was hemodynamically stable in the emergency department. Heart rate was 72 on arrival, and blood pressure was 118/57. Patient was saturating 96% on room air. Lab work revealed a mild leukocytosis of 11.2, hemoglobin 11.1, platelet count 363. Total bilirubin slightly elevated at 4.6. Chest x-ray was negative for infection. Patient was given IV fluids and Dilaudid for pain. The Dilaudid was helpful in alleviating his pain, the  patient did not fill culture going home given persistence of the pain.  Hospital Course:  Patient was admitted for sickle cell pain crisis. He was managed with prn dilaudid PO and IV Toradol, patient is opiate naive. As at this morning, he has not requested for dilaudid for over 12 hours, he said pain is at 1/10. He requested to be discharged home today. His parent reported that he started coughing this morning, productive of yellowish sputum but patient denied any SOB or chest pain, no hemoptysis. He was discharged home in a hemodynamically stable condition on Azithromax and cough expectorant. He will follow up with his PCP in one to two weeks. Prescription ordered for his hydroxyurea.  Discharge Exam: Vitals:   03/04/16 2221 03/05/16 0551  BP: (!) 118/47 (!) 115/57  Pulse: 68 63  Resp: 18 18  Temp: 98.6 F (37 C) 97.9 F (36.6 C)   Vitals:   03/04/16 1226 03/04/16 1634 03/04/16 2221 03/05/16 0551  BP: (!) 125/41 120/63 (!) 118/47 (!) 115/57  Pulse: (!) 53 75 68 63  Resp: 16 18 18 18   Temp: 98.3 F (36.8 C) 98.7 F (37.1 C) 98.6 F (37 C) 97.9 F (36.6 C)  TempSrc: Oral Oral Oral Oral  SpO2: 99% 99% 90% 93%  Weight: 76.8 kg (169 lb 5 oz)     Height: 5\' 7"  (1.702 Fowler)       General appearance : Awake, alert, not in any distress. Speech Clear. Not toxic looking HEENT: Atraumatic and Normocephalic, pupils equally reactive to light and accomodation Neck: Supple, no  JVD. No cervical lymphadenopathy.  Chest: Good air entry bilaterally, no added sounds  CVS: S1 S2 regular, no murmurs.  Abdomen: Bowel sounds present, Non tender and not distended with no gaurding, rigidity or rebound. Extremities: B/L Lower Ext shows no edema, both legs are warm to touch Neurology: Awake alert, and oriented X 3, CN II-XII intact, Non focal Skin: No Rash  Discharge Instructions    Medication List    TAKE these medications   aspirin EC 81 MG tablet Take 1 tablet (81 mg total) by mouth daily. What  changed:  when to take this  reasons to take this   azithromycin 500 MG tablet Commonly known as:  ZITHROMAX Take 1 tablet (500 mg total) by mouth daily. Start taking on:  03/06/2016   ergocalciferol 50000 units capsule Commonly known as:  VITAMIN D2 Take 1 capsule (50,000 Units total) by mouth once a week.   folic acid 1 MG tablet Commonly known as:  FOLVITE Take 1 mg by mouth daily.   guaiFENesin-dextromethorphan 100-10 MG/5ML syrup Commonly known as:  ROBITUSSIN DM Take 10 mLs by mouth every 6 (six) hours as needed for cough.   HYDROmorphone 2 MG tablet Commonly known as:  DILAUDID Take 2 mg by mouth every 4 (four) hours as needed for severe pain.   HYDROmorphone 2 MG tablet Commonly known as:  DILAUDID Take 1 tablet (2 mg total) by mouth every 4 (four) hours as needed for severe pain.   hydroxyurea 500 MG capsule Commonly known as:  HYDREA Take 4 capsules (2,000 mg total) by mouth daily. May take with food to minimize GI side effects.   ibuprofen 400 MG tablet Commonly known as:  ADVIL,MOTRIN Take 400 mg by mouth every 6 (six) hours as needed for moderate pain.   multivitamin 3-35-2 MG Tabs tablet TAKE ONE TABLET BY MOUTH ONE TIME DAILY      The results of significant diagnostics from this hospitalization (including imaging, microbiology, ancillary and laboratory) are listed below for reference.    Significant Diagnostic Studies: Dg Chest 2 View  Result Date: 03/03/2016 CLINICAL DATA:  Chest, rib and back pain in a patient with sickle cell disease. EXAM: CHEST  2 VIEW COMPARISON:  None. FINDINGS: The lungs are clear. Heart size is normal. No pneumothorax or pleural effusion. No bony abnormality is identified. Cholecystectomy clips are noted. IMPRESSION: Negative chest. Electronically Signed   By: Inge Rise Fowler.D.   On: 03/03/2016 13:33   Microbiology: No results found for this or any previous visit (from the past 240 hour(s)).   Labs: Basic Metabolic  Panel:  Recent Labs Lab 03/03/16 1548 03/04/16 0338  NA 133* 134*  K 3.7 3.7  CL 97* 98*  CO2 25 29  GLUCOSE 106* 96  BUN 6 6  CREATININE 0.69 0.74  CALCIUM 9.5 8.7*   Liver Function Tests:  Recent Labs Lab 03/03/16 1732 03/04/16 0338  AST 56* 38  ALT 34 31  ALKPHOS 68 62  BILITOT 4.6* 2.9*  PROT 8.1 7.0  ALBUMIN 4.4 3.8   No results for input(s): LIPASE, AMYLASE in the last 168 hours. No results for input(s): AMMONIA in the last 168 hours. CBC:  Recent Labs Lab 03/03/16 1548 03/04/16 0338 03/04/16 2115  WBC 11.2* 8.9 9.3  NEUTROABS  --   --  6.7  HGB 11.1* 9.6* 9.3*  HCT 29.5* 25.9* 24.5*  MCV 103.5* 104.0* 102.9*  PLT 363 337 398   Cardiac Enzymes: No results for input(s): CKTOTAL, CKMB,  CKMBINDEX, TROPONINI in the last 168 hours. BNP: Invalid input(s): POCBNP CBG: No results for input(s): GLUCAP in the last 168 hours.  Time coordinating discharge: 50 minutes  Signed:  Mahogani Holohan, [facility] 03/05/2016, 10:26 AM

## 2016-03-05 NOTE — Progress Notes (Signed)
Patient discharged to home with family, discharge instructions reviewed with patient and family who verbalized understanding. New RX given to patient.  

## 2016-03-05 NOTE — Discharge Instructions (Signed)
Sickle Cell Anemia, Adult Sickle cell anemia is a condition in which red blood cells have an abnormal "sickle" shape. This abnormal shape shortens the cells' life span, which results in a lower than normal concentration of red blood cells in the blood. The sickle shape also causes the cells to clump together and block free blood flow through the blood vessels. As a result, the tissues and organs of the body do not receive enough oxygen. Sickle cell anemia causes organ damage and pain and increases the risk of infection. CAUSES  Sickle cell anemia is a genetic disorder. Those who receive two copies of the gene have the condition, and those who receive one copy have the trait. RISK FACTORS The sickle cell gene is most common in people whose families originated in [place]. Other areas of the globe where sickle cell trait occurs include the Mediterranean, [place] and [place], the [place], and the [place].  SIGNS AND SYMPTOMS  Pain, especially in the extremities, back, chest, or abdomen (common). The pain may start suddenly or may develop following an illness, especially if there is dehydration. Pain can also occur due to overexertion or exposure to extreme temperature changes.  Frequent severe bacterial infections, especially certain types of pneumonia and meningitis.  Pain and swelling in the hands and feet.  Decreased activity.   Loss of appetite.   Change in behavior.  Headaches.  Seizures.  Shortness of breath or difficulty breathing.  Vision changes.  Skin ulcers. Those with the trait may not have symptoms or they may have mild symptoms.  DIAGNOSIS  Sickle cell anemia is diagnosed with blood tests that demonstrate the genetic trait. It is often diagnosed during the newborn period, due to mandatory testing nationwide. A variety of blood tests, X-rays, CT scans, MRI scans, ultrasounds, and lung function tests may also be done to monitor the condition. TREATMENT  Sickle  cell anemia may be treated with:  Medicines. You may be given pain medicines, antibiotic medicines (to treat and prevent infections) or medicines to increase the production of certain types of hemoglobin.  Fluids.  Oxygen.  Blood transfusions. HOME CARE INSTRUCTIONS   Drink enough fluid to keep your urine clear or pale yellow. Increase your fluid intake in hot weather and during exercise.  Do not smoke. Smoking lowers oxygen levels in the blood.   Only take over-the-counter or prescription medicines for pain, fever, or discomfort as directed by your health care provider.  Take antibiotics as directed by your health care provider. Make sure you finish them it even if you start to feel better.   Take supplements as directed by your health care provider.   Consider wearing a medical alert bracelet. This tells anyone caring for you in an emergency of your condition.   When traveling, keep your medical information, health care provider's names, and the medicines you take with you at all times.   If you develop a fever, do not take medicines to reduce the fever right away. This could cover up a problem that is developing. Notify your health care provider.  Keep all follow-up appointments with your health care provider. Sickle cell anemia requires regular medical care. SEEK MEDICAL CARE IF: You have a fever. SEEK IMMEDIATE MEDICAL CARE IF:   You feel dizzy or faint.   You have new abdominal pain, especially on the left side near the stomach area.   You develop a persistent, often uncomfortable and painful penile erection (priapism). If this is not treated immediately it   will lead to impotence.   You have numbness your arms or legs or you have a hard time moving them.   You have a hard time with speech.   You have a fever or persistent symptoms for more than 2-3 days.   You have a fever and your symptoms suddenly get worse.   You have signs or symptoms of infection.  These include:   Chills.   Abnormal tiredness (lethargy).   Irritability.   Poor eating.   Vomiting.   You develop pain that is not helped with medicine.   You develop shortness of breath.  You have pain in your chest.   You are coughing up pus-like or bloody sputum.   You develop a stiff neck.  Your feet or hands swell or have pain.  Your abdomen appears bloated.  You develop joint pain. MAKE SURE YOU:  Understand these instructions.   This information is not intended to replace advice given to you by your health care provider. Make sure you discuss any questions you have with your health care provider.   Document Released: 07/20/2005 Document Revised: 05/02/2014 Document Reviewed: 11/21/2012 Elsevier Interactive Patient Education 2016 [employer]  

## 2016-03-25 ENCOUNTER — Encounter: Payer: Self-pay | Admitting: Family Medicine

## 2016-03-25 ENCOUNTER — Ambulatory Visit (INDEPENDENT_AMBULATORY_CARE_PROVIDER_SITE_OTHER): Payer: BLUE CROSS/BLUE SHIELD | Admitting: Family Medicine

## 2016-03-25 VITALS — BP 117/41 | HR 53 | Temp 97.8°F | Resp 16 | Ht 67.0 in | Wt 161.0 lb

## 2016-03-25 DIAGNOSIS — D571 Sickle-cell disease without crisis: Secondary | ICD-10-CM

## 2016-03-25 NOTE — Progress Notes (Signed)
Ryan Fowler, is a 20 y.o. male  [id number]  [id number]  DOB - Jul 08, 1995  CC:  Chief Complaint  Patient presents with  . Follow-up    scd  . Nasal Congestion    blood nasal discharge x 2 weeks.        HPI: Ryan Fowler is a 20 y.o. male here sickle cell follow-up, He was seen and treated in ED on 11-9 for his first pain crisis this year. He rarely has to use Dilaudid. Pain is usually managed with Ibuprofen. He reports he is taking is Hydrea and Folic Acid regularly. Since discharge from ED, he denies headaches, visual disturbances, fever, shaking chills, chest pain, dyspnea, abd pain, N/V/D, dysuria, skin breakdown. His major concern today is of nasal discharge with a little bloody tinge.   He is in need of urine micral today and  Needs his annual eye exam. He will arrange the eye exam in [place]. He had bloodwork about 3 weeks ago.   Allergies  Allergen Reactions  . Morphine And Related     Unbearable pain   Past Medical History:  Diagnosis Date  . CVA (cerebral infarction)   . Migraine   . Sickle cell anemia (HCC)    Current Outpatient Prescriptions on File Prior to Visit  Medication Sig Dispense Refill  . folic acid (FOLVITE) 1 MG tablet Take 1 mg by mouth daily.    Marland Kitchen HYDROmorphone (DILAUDID) 2 MG tablet Take 2 mg by mouth every 4 (four) hours as needed for severe pain.    Marland Kitchen HYDROmorphone (DILAUDID) 2 MG tablet Take 1 tablet (2 mg total) by mouth every 4 (four) hours as needed for severe pain. 15 tablet 0  . hydroxyurea (HYDREA) 500 MG capsule Take 4 capsules (2,000 mg total) by mouth daily. May take with food to minimize GI side effects. 120 capsule 1  . ibuprofen (ADVIL,MOTRIN) 400 MG tablet Take 400 mg by mouth every 6 (six) hours as needed for moderate pain.    . multivitamin (METANX) 3-35-2 MG TABS tablet TAKE ONE TABLET BY MOUTH ONE TIME DAILY 90 tablet 2  . aspirin EC 81 MG tablet Take 1 tablet (81 mg total) by mouth daily. (Patient not taking: Reported  on 03/25/2016) 30 tablet 2  . ergocalciferol (VITAMIN D2) 50000 UNITS capsule Take 1 capsule (50,000 Units total) by mouth once a week. (Patient not taking: Reported on 03/25/2016) 4 capsule 2  . guaiFENesin-dextromethorphan (ROBITUSSIN DM) 100-10 MG/5ML syrup Take 10 mLs by mouth every 6 (six) hours as needed for cough. (Patient not taking: Reported on 03/25/2016) 480 mL 0   No current facility-administered medications on file prior to visit.    History reviewed. No pertinent family history. Social History   Social History  . Marital status: Single    Spouse name: N/A  . Number of children: N/A  . Years of education: N/A   Occupational History  . Not on file.   Social History Main Topics  . Smoking status: Former Research scientist (life sciences)  . Smokeless tobacco: Never Used  . Alcohol use No  . Drug use: No  . Sexual activity: Not on file   Other Topics Concern  . Not on file   Social History Narrative  . No narrative on file    Review of Systems: Constitutional: Negative for fever, chills, weight loss, appetite loss HENT: Denies Problems Eyes: Denies problems Neck: Denies problems Respiratory: Negative for cough, shortness of breath,   Cardiovascular: Negative for chest pain, palpitations and  leg swelling. Gastrointestinal: Negative for abdominal pain, nausea,vomitng, diarrhea, constipation. Genitourinary: Denies problems Musculoskeletal: Denies problems Neurological: Denies problems Hematological: Denies problems Psychiatric/Behavioral: Denies depression, anxiety.   Objective:   Vitals:   03/25/16 1104  BP: (!) 117/41  Pulse: (!) 53  Resp: 16  Temp: 97.8 F (36.6 C)    Physical Exam: Constitutional: Patient appears well-developed and well-nourished. No distress. HENT: Normocephalic, atraumatic, External right and left ear normal. Oropharynx is clear and moist.  Eyes: Conjunctivae and EOM are normal. PERRLA, no scleral icterus. Neck: Normal ROM. Neck supple. No lymphadenopathy,  No thyromegaly. CVS: RRR, S1/S2 +, no murmurs, no gallops, no rubs Pulmonary: Effort and breath sounds normal, no stridor, rhonchi, wheezes, rales.  Abdominal: Soft. Normoactive BS,, no distension, tenderness, rebound or guarding.  Musculoskeletal: Normal range of motion. No edema and no tenderness.  Neuro: Alert.Normal muscle tone coordination. Non-focal Skin: Skin is warm and dry. No rash noted. Not diaphoretic. No erythema. No pallor. Psychiatric: Normal mood and affect. Behavior, judgment, thought content normal.  Lab Results  Component Value Date   WBC 9.3 03/04/2016   HGB 9.3 (L) 03/04/2016   HCT 24.5 (L) 03/04/2016   MCV 102.9 (H) 03/04/2016   PLT 398 03/04/2016   Lab Results  Component Value Date   CREATININE 0.74 03/04/2016   BUN 6 03/04/2016   NA 134 (L) 03/04/2016   K 3.7 03/04/2016   CL 98 (L) 03/04/2016   CO2 29 03/04/2016    No results found for: HGBA1C Lipid Panel  No results found for: CHOL, TRIG, HDL, CHOLHDL, VLDL, LDLCALC      Assessment and plan:   1. Hb-SS disease without crisis ([place])  - Microalbumin, urine -Continue current treatment   -Sickle Cell Disease  Patient counseled and given handout on use of opoid pain medications, including need to only get from [place] and our ability to follow use on [place]  CSRS and need to keep in a safe locked place away from children and pets. Have reviewed our refill policy related to erly refills if lost or stolen. Have reviewed possible side effects of opoids, Hydrea and need to take other SCD related medications as ordered.  Have review health maintenance needs, including immunizations, urine for proteim, dilated eye exam.  Have review the importance of smoking cessation if currently smoking.   The patient was given clear instructions to go to ER or return to medical center if symptoms don't improve, worsen or new problems develop. The patient verbalized understanding. The patient was told to call to get lab results  if they haven't heard anything in the next week.     No Follow-up on file.       Micheline Chapman, MSN, FNP-BC   03/25/2016, 11:29 AM

## 2016-03-26 LAB — MICROALBUMIN, URINE: Microalb, Ur: 0.2 mg/dL

## 2016-04-01 ENCOUNTER — Ambulatory Visit: Payer: BLUE CROSS/BLUE SHIELD | Admitting: Family Medicine

## 2016-04-21 NOTE — Telephone Encounter (Signed)
See previous note

## 2016-05-04 ENCOUNTER — Ambulatory Visit (INDEPENDENT_AMBULATORY_CARE_PROVIDER_SITE_OTHER): Payer: BLUE CROSS/BLUE SHIELD | Admitting: Family Medicine

## 2016-05-04 ENCOUNTER — Encounter: Payer: Self-pay | Admitting: Family Medicine

## 2016-05-04 ENCOUNTER — Other Ambulatory Visit: Payer: Self-pay | Admitting: Family Medicine

## 2016-05-04 VITALS — BP 116/64 | HR 62 | Temp 97.5°F | Resp 16 | Ht 67.0 in | Wt 164.0 lb

## 2016-05-04 DIAGNOSIS — R369 Urethral discharge, unspecified: Secondary | ICD-10-CM | POA: Diagnosis not present

## 2016-05-04 DIAGNOSIS — R82998 Other abnormal findings in urine: Secondary | ICD-10-CM

## 2016-05-04 DIAGNOSIS — A64 Unspecified sexually transmitted disease: Secondary | ICD-10-CM | POA: Diagnosis not present

## 2016-05-04 DIAGNOSIS — R300 Dysuria: Secondary | ICD-10-CM

## 2016-05-04 DIAGNOSIS — R8299 Other abnormal findings in urine: Secondary | ICD-10-CM

## 2016-05-04 LAB — HIV ANTIBODY (ROUTINE TESTING W REFLEX): HIV: NONREACTIVE

## 2016-05-04 MED ORDER — AZITHROMYCIN 500 MG PO TABS: 500.0000 mg | ORAL_TABLET | Freq: Once | ORAL | 0 refills | Status: AC

## 2016-05-04 MED ORDER — AZITHROMYCIN 500 MG PO TABS: 500.0000 mg | ORAL_TABLET | Freq: Once | ORAL | 0 refills | Status: DC

## 2016-05-04 MED ORDER — CEFTRIAXONE SODIUM 1 G IJ SOLR
250.0000 mg | Freq: Once | INTRAMUSCULAR | Status: AC
  Administered 2016-05-04: 250 mg via INTRAMUSCULAR

## 2016-05-04 MED FILL — AZITHROMYCIN 500 MG TABLET: 500 | 2 days supply | Qty: 2 | Fill #0

## 2016-05-04 NOTE — Patient Instructions (Addendum)
Will follow up by phone as lab results become available Sexually Transmitted Disease A sexually transmitted disease (STD) is a disease or infection often passed to another person during sex. However, STDs can be passed through nonsexual ways. An STD can be passed through:  Spit (saliva).  Semen.  Blood.  Mucus from the vagina.  Pee (urine). How can I lessen my chances of getting an STD?  Use:  Latex condoms.  Water-soluble lubricants with condoms. Do not use petroleum jelly or oils.  Dental dams. These are small pieces of latex that are used as a barrier during oral sex.  Avoid having more than one sex partner.  Do not have sex with someone who has other sex partners.  Do not have sex with anyone you do not know or who is at high risk for an STD.  Avoid risky sex that can break your skin.  Do not have sex if you have open sores on your mouth or skin.  Avoid drinking too much alcohol or taking illegal drugs. Alcohol and drugs can affect your good judgment.  Avoid oral and anal sex acts.  Get shots (vaccines) for HPV and hepatitis.  If you are at risk of being infected with HIV, it is advised that you take a certain medicine daily to prevent HIV infection. This is called pre-exposure prophylaxis (PrEP). You may be at risk if:  You are a man who has sex with other men (MSM).  You are attracted to the opposite sex (heterosexual) and are having sex with more than one partner.  You take drugs with a needle.  You have sex with someone who has HIV.  Talk with your doctor about if you are at high risk of being infected with HIV. If you begin to take PrEP, get tested for HIV first. Get tested every 3 months for as long as you are taking PrEP.  Get tested for STDs every year if you are sexually active. If you are treated for an STD, get tested again 3 months after you are treated. What should I do if I think I have an STD?  See your doctor.  Tell your sex partner(s) that  you have an STD. They should be tested and treated.  Do not have sex until your doctor says it is okay. When should I get help? Get help right away if:  You have bad belly (abdominal) pain.  You are a man and have puffiness (swelling) or pain in your testicles.  You are a woman and have puffiness in your vagina. This information is not intended to replace advice given to you by your health care provider. Make sure you discuss any questions you have with your health care provider. Document Released: 05/19/2004 Document Revised: 09/17/2015 Document Reviewed: 10/05/2012 Elsevier Interactive Patient Education  2017 [employer]. Dysuria Introduction Dysuria is pain or discomfort while urinating. The pain or discomfort may be felt in the tube that carries urine out of the bladder (urethra) or in the surrounding tissue of the genitals. The pain may also be felt in the groin area, lower abdomen, and lower back. You may have to urinate frequently or have the sudden feeling that you have to urinate (urgency). Dysuria can affect both men and women, but is more common in women. Dysuria can be caused by many different things, including:  Urinary tract infection in women.  Infection of the kidney or bladder.  Kidney stones or bladder stones.  Certain sexually transmitted infections (STIs), such  as chlamydia.  Dehydration.  Inflammation of the vagina.  Use of certain medicines.  Use of certain soaps or scented products that cause irritation. Follow these instructions at home: Watch your dysuria for any changes. The following actions may help to reduce any discomfort you are feeling:  Drink enough fluid to keep your urine clear or pale yellow.  Empty your bladder often. Avoid holding urine for long periods of time.  After a bowel movement or urination, women should cleanse from front to back, using each tissue only once.  Empty your bladder after sexual intercourse.  Take medicines  only as directed by your health care provider.  If you were prescribed an antibiotic medicine, finish it all even if you start to feel better.  Avoid caffeine, tea, and alcohol. They can irritate the bladder and make dysuria worse. In men, alcohol may irritate the prostate.  Keep all follow-up visits as directed by your health care provider. This is important.  If you had any tests done to find the cause of dysuria, it is your responsibility to obtain your test results. Ask the lab or department performing the test when and how you will get your results. Talk with your health care provider if you have any questions about your results. Contact a health care provider if:  You develop pain in your back or sides.  You have a fever.  You have nausea or vomiting.  You have blood in your urine.  You are not urinating as often as you usually do. Get help right away if:  You pain is severe and not relieved with medicines.  You are unable to hold down any fluids.  You or someone else notices a change in your mental function.  You have a rapid heartbeat at rest.  You have shaking or chills.  You feel extremely weak. This information is not intended to replace advice given to you by your health care provider. Make sure you discuss any questions you have with your health care provider. Document Released: 01/08/2004 Document Revised: 09/17/2015 Document Reviewed: 12/05/2013  2017 Elsevier

## 2016-05-04 NOTE — Progress Notes (Signed)
Subjective:    Patient ID: Ryan Fowler, male    DOB: May 15, 1995, 21 y.o.   MRN: [id number]  Penile Discharge  The patient's primary symptoms include penile discharge and penile pain. The current episode started in the past 7 days. The problem has been unchanged. Associated symptoms include dysuria. Pertinent negatives include no anorexia, chest pain, chills, coughing, diarrhea, discolored urine, fever, flank pain, joint pain, joint swelling or painful intercourse. The penile discharge was thick. He has tried nothing for the symptoms. The treatment provided significant relief. He is sexually active. He inconsistently uses condoms. No, his partner does not have an STD. His past medical history is significant for sickle cell disease. There is no history of BPH, chlamydia, gonorrhea, herpes simplex, HIV, syphilis or varicocele.  Dysuria   The current episode started in the past 7 days. The pain is at a severity of 4/10. The pain is mild. There has been no fever. He is sexually active. There is no history of pyelonephritis. Pertinent negatives include no chills or flank pain.   Past Medical History:  Diagnosis Date  . CVA (cerebral infarction)   . Migraine   . Sickle cell anemia (HCC)    Social History   Social History  . Marital status: Single    Spouse name: N/A  . Number of children: N/A  . Years of education: N/A   Occupational History  . Not on file.   Social History Main Topics  . Smoking status: Former Research scientist (life sciences)  . Smokeless tobacco: Never Used  . Alcohol use No  . Drug use: No  . Sexual activity: Not on file   Other Topics Concern  . Not on file   Social History Narrative  . No narrative on file   Review of Systems  Constitutional: Negative.  Negative for chills and fever.  HENT: Negative.   Eyes: Negative.   Respiratory: Negative.  Negative for cough.   Cardiovascular: Negative.  Negative for chest pain.  Gastrointestinal: Negative.  Negative for anorexia and  diarrhea.  Endocrine: Negative.   Genitourinary: Positive for discharge, dysuria and penile pain. Negative for flank pain.  Musculoskeletal: Negative.  Negative for joint pain.  Skin: Negative.   Allergic/Immunologic: Negative.   Neurological: Negative.   Hematological: Negative.   Psychiatric/Behavioral: Negative.        Objective:   Physical Exam  Constitutional: He is oriented to person, place, and time. He appears well-developed and well-nourished.  HENT:  Head: Normocephalic and atraumatic.  Right Ear: External ear normal.  Left Ear: External ear normal.  Nose: Nose normal.  Mouth/Throat: Oropharynx is clear and moist.  Eyes: Conjunctivae are normal. Pupils are equal, round, and reactive to light.  Neck: Normal range of motion. Neck supple.  Pulmonary/Chest: Effort normal and breath sounds normal.  Abdominal: Soft. Bowel sounds are normal.  Genitourinary: Testes normal. Penile tenderness present. Discharge (clear, small) found.  Musculoskeletal: Normal range of motion.  Neurological: He is alert and oriented to person, place, and time. He has normal reflexes.  Skin: Skin is warm and dry.  Psychiatric: He has a normal mood and affect. His behavior is normal. Judgment and thought content normal.         BP 116/64 (BP Location: Right Arm, Patient Position: Sitting, Cuff Size: Normal)   Pulse 62   Temp 97.5 F (36.4 C) (Oral)   Resp 16   Ht 5\' 7"  (1.702 m)   Wt 164 lb (74.4 kg)   SpO2 100%  BMI 25.69 kg/m  Assessment & Plan:  1. Penile discharge Recommend barrier protection with sexual intercourse. Will follow up by phone with laboratory results and treatment plan.  - GC/Chlamydia Probe Amp  2. Dysuria - GC/Chlamydia Probe Amp -urinalysis 3. Sexually transmitted disease in male - GC/Chlamydia Probe Amp - HIV antibody (with reflex) - RPR   RTC: Will follow up by phone with laboratory results Schedule a 6 month follow up for sickle cell  anemia   Dorena Dew, FNP

## 2016-05-04 NOTE — Addendum Note (Signed)
Addended by: Adelina Mings on: 05/04/2016 01:28 PM   Modules accepted: Orders

## 2016-05-05 ENCOUNTER — Telehealth: Payer: Self-pay | Admitting: Family Medicine

## 2016-05-05 LAB — GC/CHLAMYDIA PROBE AMP
CT Probe RNA: DETECTED — AB
GC Probe RNA: NOT DETECTED

## 2016-05-05 LAB — POCT URINALYSIS DIP (DEVICE)
BILIRUBIN URINE: NEGATIVE
Glucose, UA: NEGATIVE mg/dL
KETONES UR: NEGATIVE mg/dL
Nitrite: NEGATIVE
Protein, ur: NEGATIVE mg/dL
Specific Gravity, Urine: 1.01 (ref 1.005–1.030)
Urobilinogen, UA: 0.2 mg/dL (ref 0.0–1.0)
pH: 5.5 (ref 5.0–8.0)

## 2016-05-05 LAB — RPR

## 2016-05-05 NOTE — Telephone Encounter (Signed)
Left message for return call on 05/06/2015 at 1716 to discuss laboratory findings.     Dorena Dew, FNP

## 2016-05-06 LAB — URINE CULTURE: Organism ID, Bacteria: NO GROWTH

## 2016-05-18 ENCOUNTER — Other Ambulatory Visit: Payer: Self-pay | Admitting: Family Medicine

## 2016-05-18 DIAGNOSIS — A749 Chlamydial infection, unspecified: Secondary | ICD-10-CM

## 2016-05-18 MED ORDER — DOXYCYCLINE HYCLATE 100 MG PO TABS: 100.0000 mg | ORAL_TABLET | Freq: Two times a day (BID) | ORAL | 0 refills | Status: DC

## 2016-05-18 NOTE — Progress Notes (Signed)
Meds ordered this encounter  Medications  . doxycycline (VIBRA-TABS) 100 MG tablet    Sig: Take 1 tablet (100 mg total) by mouth 2 (two) times daily.    Dispense:  14 tablet    Refill:  0  Ellisa Devivo M, FNP

## 2016-05-27 ENCOUNTER — Other Ambulatory Visit: Payer: Self-pay | Admitting: Family Medicine

## 2016-05-27 ENCOUNTER — Encounter: Payer: BLUE CROSS/BLUE SHIELD | Admitting: Family Medicine

## 2016-05-27 DIAGNOSIS — A749 Chlamydial infection, unspecified: Secondary | ICD-10-CM

## 2016-05-27 NOTE — Progress Notes (Signed)
This encounter was created in error - please disregard.

## 2016-05-28 LAB — GC/CHLAMYDIA PROBE AMP
CT Probe RNA: NOT DETECTED
GC PROBE AMP APTIMA: NOT DETECTED

## 2016-05-30 ENCOUNTER — Telehealth: Payer: Self-pay

## 2016-05-30 NOTE — Telephone Encounter (Signed)
Patient has been advised of negative test. Thanks!

## 2016-05-30 NOTE — Telephone Encounter (Signed)
-----   Message from Dorena Dew, [place] sent at 05/30/2016  8:11 AM EST ----- Please inform patient that test of cure for chlamydia was negative. Please follow up in office as previously scheduled ----- Message ----- From: Interface, Lab In [facility] Sent: 05/28/2016  11:22 AM To: Dorena Dew, FNP

## 2016-06-15 ENCOUNTER — Other Ambulatory Visit: Payer: Self-pay

## 2016-06-15 MED ORDER — HYDROXYUREA 500 MG PO CAPS: 2000.0000 mg | ORAL_CAPSULE | Freq: Every day | ORAL | 1 refills | Status: DC

## 2016-08-01 ENCOUNTER — Other Ambulatory Visit: Payer: Self-pay | Admitting: Family Medicine

## 2016-10-21 ENCOUNTER — Other Ambulatory Visit: Payer: Self-pay

## 2016-10-21 MED ORDER — HYDROXYUREA 500 MG PO CAPS: 2000.0000 mg | ORAL_CAPSULE | Freq: Every day | ORAL | 0 refills | Status: DC

## 2016-10-21 NOTE — Telephone Encounter (Signed)
Refill for hydroxyurea sent into pharmacy. Thanks!  

## 2016-11-29 ENCOUNTER — Other Ambulatory Visit: Payer: Self-pay

## 2016-11-29 MED ORDER — HYDROXYUREA 500 MG PO CAPS: 2000.0000 mg | ORAL_CAPSULE | Freq: Every day | ORAL | 0 refills | Status: DC

## 2016-12-06 ENCOUNTER — Ambulatory Visit (INDEPENDENT_AMBULATORY_CARE_PROVIDER_SITE_OTHER): Payer: BLUE CROSS/BLUE SHIELD | Admitting: Family Medicine

## 2016-12-06 ENCOUNTER — Encounter: Payer: Self-pay | Admitting: Family Medicine

## 2016-12-06 VITALS — BP 128/62 | HR 64 | Temp 98.7°F | Resp 14 | Ht 67.0 in | Wt 166.6 lb

## 2016-12-06 DIAGNOSIS — D571 Sickle-cell disease without crisis: Secondary | ICD-10-CM | POA: Diagnosis not present

## 2016-12-06 DIAGNOSIS — R7989 Other specified abnormal findings of blood chemistry: Secondary | ICD-10-CM

## 2016-12-06 DIAGNOSIS — Z113 Encounter for screening for infections with a predominantly sexual mode of transmission: Secondary | ICD-10-CM

## 2016-12-06 DIAGNOSIS — R945 Abnormal results of liver function studies: Secondary | ICD-10-CM

## 2016-12-06 LAB — CBC WITH DIFFERENTIAL/PLATELET
BASOS PCT: 0 %
Basophils Absolute: 0 cells/uL (ref 0–200)
EOS PCT: 1 %
Eosinophils Absolute: 60 cells/uL (ref 15–500)
HEMATOCRIT: 29.4 % — AB (ref 38.5–50.0)
HEMOGLOBIN: 10.2 g/dL — AB (ref 13.2–17.1)
LYMPHS ABS: 1380 {cells}/uL (ref 850–3900)
Lymphocytes Relative: 23 %
MCH: 39.4 pg — ABNORMAL HIGH (ref 27.0–33.0)
MCHC: 34.7 g/dL (ref 32.0–36.0)
MCV: 113.5 fL — ABNORMAL HIGH (ref 80.0–100.0)
MONO ABS: 660 {cells}/uL (ref 200–950)
MPV: 9.5 fL (ref 7.5–12.5)
Monocytes Relative: 11 %
NEUTROS ABS: 3900 {cells}/uL (ref 1500–7800)
NEUTROS PCT: 65 %
Platelets: 433 10*3/uL — ABNORMAL HIGH (ref 140–400)
RBC: 2.59 MIL/uL — AB (ref 4.20–5.80)
RDW: 19.6 % — ABNORMAL HIGH (ref 11.0–15.0)
WBC: 6 10*3/uL (ref 3.8–10.8)

## 2016-12-06 LAB — POCT URINALYSIS DIP (DEVICE)
Bilirubin Urine: NEGATIVE
GLUCOSE, UA: NEGATIVE mg/dL
Ketones, ur: NEGATIVE mg/dL
Leukocytes, UA: NEGATIVE
Nitrite: NEGATIVE
PROTEIN: NEGATIVE mg/dL
UROBILINOGEN UA: 0.2 mg/dL (ref 0.0–1.0)
pH: 5.5 (ref 5.0–8.0)

## 2016-12-06 LAB — RETICULOCYTES
ABS RETIC: 194250 {cells}/uL — AB (ref 25000–90000)
RBC.: 2.59 MIL/uL — AB (ref 4.20–5.80)
RETIC CT PCT: 7.5 %

## 2016-12-06 MED ORDER — PNEUMOCOCCAL 13-VAL CONJ VACC IM SUSP
0.5000 mL | Freq: Once | INTRAMUSCULAR | Status: AC
  Administered 2016-12-06: 0.5 mL via INTRAMUSCULAR

## 2016-12-06 NOTE — Progress Notes (Signed)
Patient ID: Ryan Fowler, male    DOB: 12-Jul-1995, 21 y.o.   MRN: [id number]  PCP: Ryan Dew, FNP  Chief Complaint  Patient presents with  . Follow-up    6 MONTH    Subjective:  HPI Ryan Fowler is a 21 y.o. male presents for evaluation of Sickle Cell Anemia. 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 He suffers from sickle cell anemia which he reports good control and infrequent crisis. He reports that he recently experienced generalized joint pain around 1 week ago which was relieved with rest, hydration, and ibuprofen. Ryan Fowler is not prescribed any opioid medications presently. Reports compliance with hydroxyurea and folic acid. He has no other complaints today. He is interested in STI testing today.  Social History   Social History  . Marital status: Single    Spouse name: N/A  . Number of children: N/A  . Years of education: N/A   Occupational History  . Not on file.   Social History Main Topics  . Smoking status: Former Research scientist (life sciences)  . Smokeless tobacco: Never Used  . Alcohol use No  . Drug use: No  . Sexual activity: Not on file   Other Topics Concern  . Not on file   Social History Narrative  . No narrative on file   Review of Systems See HPI  Patient Active Problem List   Diagnosis Date Noted  . Sickle cell pain crisis ([place]) 03/03/2016  . Thrombocytosis ([place]) 03/27/2015  . Penile lesion 03/12/2015  . Vitamin D deficiency 03/10/2014  . Hb-SS disease without crisis ([place]) 01/27/2014    Allergies  Allergen Reactions  . Morphine And Related     Unbearable pain    Prior to Admission medications   Medication Sig Start Date End Date Taking? Authorizing Provider  aspirin EC 81 MG tablet Take 1 tablet (81 mg total) by mouth daily. 03/27/15  Yes Ryan Dew, FNP  folic acid (FOLVITE) 1 MG tablet Take 1 mg by mouth daily.   Yes [provider]   HYDROmorphone (DILAUDID) 2 MG tablet Take 2 mg by mouth every 4 (four) hours as needed for severe pain.   Yes [provider]  hydroxyurea (HYDREA) 500 MG capsule Take 4 capsules (2,000 mg total) by mouth daily. May take with food to minimize GI side effects. 11/29/16  Yes Ryan Dew, FNP  ibuprofen (ADVIL,MOTRIN) 400 MG tablet Take 400 mg by mouth every 6 (six) hours as needed for moderate pain.   Yes [provider]  multivitamin (METANX) 3-35-2 MG TABS tablet TAKE ONE TABLET BY MOUTH ONE TIME DAILY 12/29/15  Yes Micheline Chapman, NP  doxycycline (VIBRA-TABS) 100 MG tablet Take 1 tablet (100 mg total) by mouth 2 (two) times daily. Patient not taking: Reported on 12/06/2016 05/18/16   Ryan Dew, FNP  ergocalciferol (VITAMIN D2) 50000 UNITS capsule Take 1 capsule (50,000 Units total) by mouth once a week. Patient not taking: Reported on 05/04/2016 03/10/14   Leana Gamer, MD  guaiFENesin-dextromethorphan [facility] DM) 100-10 MG/5ML syrup Take 10 mLs by mouth every 6 (six) hours as needed for cough. Patient not taking: Reported on 05/04/2016 03/05/16   Tresa Garter, MD  HYDROmorphone (DILAUDID) 2 MG tablet Take 1 tablet (2 mg total) by mouth every 4 (four) hours as needed for severe pain. 07/24/13   Jeannett Senior, PA-C    Past Medical, Surgical Family and  Social History reviewed and updated.    Objective:   Today's Vitals   12/06/16 0901  BP: 128/62  Pulse: 64  Resp: 14  Temp: 98.7 F (37.1 C)  TempSrc: Oral  SpO2: 96%  Weight: 166 lb 9.6 oz (75.6 kg)  Height: 5\' 7"  (1.702 m)    Wt Readings from Last 3 Encounters:  12/06/16 166 lb 9.6 oz (75.6 kg)  05/27/16 162 lb (73.5 kg)  05/04/16 164 lb (74.4 kg)   Physical Exam  Constitutional: He is oriented to person, place, and time. He appears well-developed and well-nourished.  HENT:  Head: Normocephalic and atraumatic.  Right Ear: External ear normal.  Left Ear: External ear normal.   Mouth/Throat: Oropharynx is clear and moist.  Eyes: Pupils are equal, round, and reactive to light. Conjunctivae and EOM are normal. No scleral icterus.  Neck: Normal range of motion. Neck supple.  Cardiovascular: Normal rate, regular rhythm, normal heart sounds and intact distal pulses.   Pulmonary/Chest: Effort normal and breath sounds normal.  Abdominal: Bowel sounds are normal. He exhibits no distension and no mass. There is no tenderness. There is no rebound and no guarding.  Musculoskeletal: Normal range of motion.  Neurological: He is alert and oriented to person, place, and time.  Skin: Skin is warm and dry.  Psychiatric: He has a normal mood and affect. His behavior is normal. Judgment and thought content normal.   Assessment & Plan:  1. Hb-SS disease without crisis (HCC) , stable Continue Hydrea. We discussed the need for good hydration, monitoring of hydration status, avoidance of heat, cold, stress, and infection triggers. We discussed the risks and benefits of Hydrea, including bone marrow suppression, the possibility of GI upset, skin ulcers, hair thinning, and teratogenicity. The patient was reminded of the need to seek medical attention for any symptoms of bleeding, anemia, or infection. Continue folic acid 1 mg daily to prevent aplastic bone marrow crises.  Pulmonary evaluation - Patient denies severe recurrent wheezes, shortness of breath with exercise, or persistent cough. If these symptoms develop, pulmonary function tests with spirometry will be ordered, and if abnormal, plan on referral to Pulmonology for further evaluation.  Eye - High risk of proliferative retinopathy. Annual eye exam with retinal exam recommended to patient, the patient has had eye exam this year.  Immunization status - Yearly influenza vaccination is recommended, as well as being up to date with Meningococcal and Pneumococcal vaccines.   2. Screen for STD (sexually transmitted disease)  Orders Placed  This Encounter  Procedures  . GC/Chlamydia Probe Amp  . Reticulocytes  . COMPLETE METABOLIC PANEL WITH GFR  . CBC with Differential/Platelet  . HIV antibody (with reflex)  . RPR  . COMPLETE METABOLIC PANEL WITH GFR  . POCT urinalysis dip (device)   RTC: 3 months with Cammie Sickle, FNP-C for sickle cell follow-up  [name]. [name], MSN, FNP-C [facility]  [name] [place], [place] [address] [phone]

## 2016-12-06 NOTE — Patient Instructions (Signed)
Sickle Cell Anemia, Adult °Sickle cell anemia is a condition where your red blood cells are shaped like sickles. Red blood cells carry oxygen through the body. Sickle-shaped red blood cells do not live as long as normal red blood cells. They also clump together and block blood from flowing through the blood vessels. These things prevent the body from getting enough oxygen. Sickle cell anemia causes organ damage and pain. It also increases the risk of infection. °Follow these instructions at home: °· Drink enough fluid to keep your pee (urine) clear or pale yellow. Drink more in hot weather and during exercise. °· Do not smoke. Smoking lowers oxygen levels in the blood. °· Only take over-the-counter or prescription medicines as told by your doctor. °· Take antibiotic medicines as told by your doctor. Make sure you finish them even if you start to feel better. °· Take supplements as told by your doctor. °· Consider wearing a medical alert bracelet. This tells anyone caring for you in an emergency of your condition. °· When traveling, keep your medical information, doctors' names, and the medicines you take with you at all times. °· If you have a fever, do not take fever medicines right away. This could cover up a problem. Tell your doctor. °· Keep all follow-up visits with your doctor. Sickle cell anemia requires regular medical care. °Contact a doctor if: °You have a fever. °Get help right away if: °· You feel dizzy or faint. °· You have new belly (abdominal) pain, especially on the left side near the stomach area. °· You have a lasting, often uncomfortable and painful erection of the penis (priapism). If it is not treated right away, you will become unable to have sex (impotence). °· You have numbness in your arms or legs or you have a hard time moving them. °· You have a hard time talking. °· You have a fever or lasting symptoms for more than 2-3 days. °· You have a fever and your symptoms suddenly get  worse. °· You have signs or symptoms of infection. These include: °? Chills. °? Being more tired than normal (lethargy). °? Irritability. °? Poor eating. °? Throwing up (vomiting). °· You have pain that is not helped with medicine. °· You have shortness of breath. °· You have pain in your chest. °· You are coughing up pus-like or bloody mucus. °· You have a stiff neck. °· Your feet or hands swell or have pain. °· Your belly looks bloated. °· Your joints hurt. °This information is not intended to replace advice given to you by your health care provider. Make sure you discuss any questions you have with your health care provider. °Document Released: 01/30/2013 Document Revised: 09/17/2015 Document Reviewed: 11/21/2012 °Elsevier Interactive Patient Education © 2017 Elsevier Inc. ° °

## 2016-12-07 LAB — COMPLETE METABOLIC PANEL WITH GFR
ALT: 69 U/L — ABNORMAL HIGH (ref 9–46)
AST: 80 U/L — ABNORMAL HIGH (ref 10–40)
Albumin: 4.2 g/dL (ref 3.6–5.1)
Alkaline Phosphatase: 60 U/L (ref 40–115)
BUN: 11 mg/dL (ref 7–25)
CHLORIDE: 105 mmol/L (ref 98–110)
CO2: 18 mmol/L — ABNORMAL LOW (ref 20–32)
Calcium: 8.8 mg/dL (ref 8.6–10.3)
Creat: 1.55 mg/dL — ABNORMAL HIGH (ref 0.60–1.35)
GFR, EST AFRICAN AMERICAN: 73 mL/min (ref >=60)
GFR, EST NON AFRICAN AMERICAN: 63 mL/min (ref >=60)
GLUCOSE: 79 mg/dL (ref 65–99)
POTASSIUM: 4.3 mmol/L (ref 3.5–5.3)
SODIUM: 138 mmol/L (ref 135–146)
TOTAL PROTEIN: 6.9 g/dL (ref 6.1–8.1)
Total Bilirubin: 4.3 mg/dL — ABNORMAL HIGH (ref 0.2–1.2)

## 2016-12-07 LAB — GC/CHLAMYDIA PROBE AMP
CT Probe RNA: NOT DETECTED
GC PROBE AMP APTIMA: NOT DETECTED

## 2016-12-07 LAB — HIV ANTIBODY (ROUTINE TESTING W REFLEX): HIV: NONREACTIVE

## 2016-12-07 LAB — RPR

## 2016-12-11 ENCOUNTER — Telehealth: Payer: Self-pay | Admitting: Family Medicine

## 2016-12-11 NOTE — Telephone Encounter (Signed)
Ryan Fowler, the message below was a lab message sent last week. I did not see documentation that patient was contacted. Please contact patient or if you've already spoken with patient, please update system.  Thanks.   Please contact patient to advise that his labs were normal with the exception of elevated liver enzymes and elevated creatinine level which is a marker of renal function. For him to return to office on Monday to have these labs repeated as they could have been idiopathically elevated without a clinical indication. If liver enzymes remain elevated I will order a liver ultrasound.  For elevated creatinine he should drink at least 6-8 glasses of water daily to keep kidneys hydrated. He should avoid drinking alcohol and taking Tylenol until his liver enzymes normalized.  [name]. [name], MSN, FNP-C [facility]  [name] [place], [place] [address] [phone]

## 2016-12-11 NOTE — Telephone Encounter (Signed)
Please contact patient to advise that his labs were normal with the exception of elevated liver enzymes and elevated creatinine level which is a marker of renal function. For him to return to office on Monday to have these labs repeated as they could have been idiopathically elevated without a clinical indication. If liver enzymes remain elevated I will order a liver ultrasound.  For elevated creatinine he should drink at least 6-8 glasses of water daily to keep kidneys hydrated. He should avoid drinking alcohol and taking Tylenol until his liver enzymes normalized.

## 2016-12-12 ENCOUNTER — Encounter: Payer: Self-pay | Admitting: Family Medicine

## 2016-12-12 ENCOUNTER — Ambulatory Visit (HOSPITAL_COMMUNITY)
Admission: RE | Admit: 2016-12-12 | Discharge: 2016-12-12 | Disposition: A | Discharge status: Home / Self Care | Payer: BLUE CROSS/BLUE SHIELD | Source: Ambulatory Visit | Attending: Family Medicine | Admitting: Family Medicine

## 2016-12-12 ENCOUNTER — Ambulatory Visit (INDEPENDENT_AMBULATORY_CARE_PROVIDER_SITE_OTHER): Payer: BLUE CROSS/BLUE SHIELD | Admitting: Family Medicine

## 2016-12-12 VITALS — BP 129/60 | HR 58 | Temp 97.8°F | Resp 16 | Ht 67.0 in | Wt 167.0 lb

## 2016-12-12 DIAGNOSIS — M79662 Pain in left lower leg: Secondary | ICD-10-CM | POA: Diagnosis not present

## 2016-12-12 DIAGNOSIS — D5700 Hb-SS disease with crisis, unspecified: Secondary | ICD-10-CM

## 2016-12-12 LAB — COMPREHENSIVE METABOLIC PANEL
ALBUMIN: 4.3 g/dL (ref 3.5–5.0)
ALK PHOS: 58 U/L (ref 38–126)
ALT: 30 U/L (ref 17–63)
ANION GAP: 10 (ref 5–15)
AST: 37 U/L (ref 15–41)
BUN: 8 mg/dL (ref 6–20)
CALCIUM: 9.3 mg/dL (ref 8.9–10.3)
CO2: 24 mmol/L (ref 22–32)
Chloride: 103 mmol/L (ref 101–111)
Creatinine, Ser: 0.73 mg/dL (ref 0.61–1.24)
Glucose, Bld: 80 mg/dL (ref 65–99)
Potassium: 3.9 mmol/L (ref 3.5–5.1)
SODIUM: 137 mmol/L (ref 135–145)
TOTAL PROTEIN: 7.6 g/dL (ref 6.5–8.1)
Total Bilirubin: 2.8 mg/dL — ABNORMAL HIGH (ref 0.3–1.2)

## 2016-12-12 LAB — CBC WITH DIFFERENTIAL/PLATELET
BASOS PCT: 1 %
Basophils Absolute: 0.1 10*3/uL (ref 0.0–0.1)
EOS PCT: 1 %
Eosinophils Absolute: 0.1 10*3/uL (ref 0.0–0.7)
HEMATOCRIT: 28.8 % — AB (ref 39.0–52.0)
Hemoglobin: 10.6 g/dL — ABNORMAL LOW (ref 13.0–17.0)
LYMPHS ABS: 2.8 10*3/uL (ref 0.7–4.0)
Lymphocytes Relative: 47 %
MCH: 39.3 pg — AB (ref 26.0–34.0)
MCHC: 36.8 g/dL — ABNORMAL HIGH (ref 30.0–36.0)
MCV: 106.7 fL — AB (ref 78.0–100.0)
MONO ABS: 0.7 10*3/uL (ref 0.1–1.0)
Monocytes Relative: 11 %
NRBC: 1 /100{WBCs} — AB
Neutro Abs: 2.5 10*3/uL (ref 1.7–7.7)
Neutrophils Relative %: 40 %
PLATELETS: 481 10*3/uL — AB (ref 150–400)
RBC: 2.7 MIL/uL — ABNORMAL LOW (ref 4.22–5.81)
RDW: 19.1 % — AB (ref 11.5–15.5)
WBC: 6.2 10*3/uL (ref 4.0–10.5)

## 2016-12-12 LAB — RETICULOCYTES
RBC.: 2.7 MIL/uL — AB (ref 4.22–5.81)
RETIC COUNT ABSOLUTE: 297 10*3/uL — AB (ref 19.0–186.0)
RETIC CT PCT: 11 % — AB (ref 0.4–3.1)

## 2016-12-12 LAB — D-DIMER, QUANTITATIVE: D-Dimer, Quant: 0.33 ug/mL-FEU (ref 0.00–0.50)

## 2016-12-12 MED ORDER — IBUPROFEN 600 MG PO TABS: 600.0000 mg | ORAL_TABLET | Freq: Three times a day (TID) | ORAL | 0 refills | Status: AC | PRN

## 2016-12-12 MED ORDER — KETOROLAC TROMETHAMINE 30 MG/ML IJ SOLN
30.0000 mg | Freq: Once | INTRAMUSCULAR | Status: DC
Start: 2016-12-12 — End: 2016-12-12
  Filled 2016-12-12: qty 1

## 2016-12-12 MED ORDER — KETOROLAC TROMETHAMINE 15 MG/ML IJ SOLN
15.0000 mg | Freq: Once | INTRAMUSCULAR | Status: DC
  Filled 2016-12-12: qty 1

## 2016-12-12 MED ORDER — KETOROLAC TROMETHAMINE 30 MG/ML IJ SOLN
15.0000 mg | Freq: Once | INTRAMUSCULAR | Status: AC
  Administered 2016-12-12: 15 mg via INTRAVENOUS

## 2016-12-12 MED ORDER — HYDROMORPHONE HCL-NACL 0.5-0.9 MG/ML-% IV SOSY
0.5000 mg | PREFILLED_SYRINGE | Freq: Once | INTRAVENOUS | Status: AC
Start: 2016-12-12 — End: 2016-12-12
  Administered 2016-12-12: 0.5 mg via INTRAVENOUS
  Filled 2016-12-12: qty 1

## 2016-12-12 MED ORDER — DEXTROSE 5 % AND 0.45 % NACL IV BOLUS
500.0000 mL | Freq: Once | INTRAVENOUS | Status: AC
  Administered 2016-12-12: 500 mL via INTRAVENOUS

## 2016-12-12 NOTE — Progress Notes (Signed)
Subjective:    Patient ID: Ryan Fowler, male    DOB: 1995-11-17, 21 y.o.   MRN: [id number]  Ryan Fowler, a 21 year old male with a history of sickle cell anemia, HbSS presents complaining of left leg pain over the past 3 days. He says that he noticed leg pain 3 days ago after leaving the gym. He mostly uses OTC Ibuprofen for pain. He occasionally uses Dilaudid 2 mg for pain. He is opiate naive. He has not utilized any OTC interventions to alleviate pain. He also endorses fatigue.    Leg Pain   The pain is present in the left leg. The pain is at a severity of 4/10. The pain is moderate. The pain has been fluctuating since onset. Pertinent negatives include no inability to bear weight, loss of motion, loss of sensation or muscle weakness. Associated symptoms comments: Pain with increased standing. The symptoms are aggravated by weight bearing. He has tried nothing for the symptoms.   Past Medical History:  Diagnosis Date  . CVA (cerebral infarction)   . Migraine   . Sickle cell anemia (HCC)    Social History   Social History  . Marital status: Single    Spouse name: N/A  . Number of children: N/A  . Years of education: N/A   Occupational History  . Not on file.   Social History Main Topics  . Smoking status: Former Research scientist (life sciences)  . Smokeless tobacco: Never Used  . Alcohol use No  . Drug use: No  . Sexual activity: Not on file   Other Topics Concern  . Not on file   Social History Narrative  . No narrative on file   Immunization History  Administered Date(s) Administered  . HPV Quadrivalent 03/27/2015  . Influenza,inj,Quad PF,36+ Mos 03/12/2015, 03/05/2016  . Pneumococcal Conjugate-13 12/06/2016  . Pneumococcal Polysaccharide-23 03/05/2016     Review of Systems  Constitutional: Negative.   HENT: Negative.   Eyes: Negative.   Respiratory: Negative.   Cardiovascular: Negative.   Gastrointestinal: Negative.   Endocrine: Negative.   Genitourinary: Negative.    Musculoskeletal: Positive for back pain and myalgias.       Left left pain  Skin: Negative.  Negative for rash.  Allergic/Immunologic: Negative.   Neurological: Negative.   Hematological: Negative.   Psychiatric/Behavioral: Negative.  The patient is not nervous/anxious and is not hyperactive.        Objective:   Physical Exam  Constitutional: He is oriented to person, place, and time.  HENT:  Head: Normocephalic and atraumatic.  Right Ear: External ear normal.  Left Ear: External ear normal.  Nose: Nose normal.  Mouth/Throat: Oropharynx is clear and moist.  Eyes: Pupils are equal, round, and reactive to light. Conjunctivae are normal.  Neck: Normal range of motion. Neck supple.  Cardiovascular: Normal rate, regular rhythm and normal heart sounds.   Pulmonary/Chest: Effort normal and breath sounds normal.  Abdominal: Soft. Bowel sounds are normal.  Musculoskeletal: Normal range of motion.       Left lower leg: He exhibits tenderness and edema.  Neurological: He is alert and oriented to person, place, and time. He has normal reflexes.  Skin: Skin is warm and dry.  Psychiatric: He has a normal mood and affect. His behavior is normal. Thought content normal.      BP 129/60 (BP Location: Left Arm, Patient Position: Sitting, Cuff Size: Normal)   Pulse (!) 58   Temp 97.8 F (36.6 C) (Oral)   Resp 16  Ht 5\' 7"  (1.702 m)   Wt 167 lb (75.8 kg)   SpO2 100%   BMI 26.16 kg/m  Assessment & Plan:  1. Sickle cell crisis [facility]) Patient complaining of pain to left calf, which is consistent with sickle cell anemia.  Patient will transition to the day infusion center for a bolus of IV fluids for cellular rehydration Will also treat with Toradol 15 mg IV for inflammation and Dilaudid for pain management.   Will continue Hydroxyrurea as previously prescribed. We discussed the need for good hydration, monitoring of hydration status, avoidance of heat, cold, stress, and infection triggers.  We discussed the risks and benefits of Hydrea, including bone marrow suppression, the possibility of GI upset, skin ulcers, hair thinning, and teratogenicity. The patient was reminded of the need to seek medical attention of any symptoms of bleeding, anemia, or infection. Continue folic acid 1 mg daily to prevent aplastic bone marrow crises.   Continue Dilaudid 2 mg po for moderate to severe pain Continue Ibuprofen 600 mg every 8 hours with food for mild to moderate pain  - ibuprofen (ADVIL,MOTRIN) 600 MG tablet; Take 1 tablet (600 mg total) by mouth every 8 (eight) hours as needed.  Dispense: 30 tablet; Refill: 0  2. Pain of left calf Will review d-dimer as results become available - VAS [place] LOWER EXTREMITY VENOUS (DVT); Future    RTC: Will follow up by phone with any abnormal laboratory results   [name]  MSN, FNP-C [facility] [address] [place], [place] [address] [phone]   The patient was given clear instructions to go to ER or return to medical center if symptoms do not improve, worsen or new problems develop. The patient verbalized understanding. Will notify patient with laboratory results.

## 2016-12-12 NOTE — Patient Instructions (Signed)
Sickle Cell Anemia, Adult °Sickle cell anemia is a condition in which red blood cells have an abnormal “sickle” shape. This abnormal shape shortens the cells’ life span, which results in a lower than normal concentration of red blood cells in the blood. The sickle shape also causes the cells to clump together and block free blood flow through the blood vessels. As a result, the tissues and organs of the body do not receive enough oxygen. Sickle cell anemia causes organ damage and pain and increases the risk of infection. °What are the causes? °Sickle cell anemia is a genetic disorder. Those who receive two copies of the gene have the condition, and those who receive one copy have the trait. °What increases the risk? °The sickle cell gene is most common in people whose families originated in [place]. Other areas of the globe where sickle cell trait occurs include the Mediterranean, [place] and [place], the [place], and the [place]. °What are the signs or symptoms? °· Pain, especially in the extremities, back, chest, or abdomen (common). The pain may start suddenly or may develop following an illness, especially if there is dehydration. Pain can also occur due to overexertion or exposure to extreme temperature changes. °· Frequent severe bacterial infections, especially certain types of pneumonia and meningitis. °· Pain and swelling in the hands and feet. °· Decreased activity. °· Loss of appetite. °· Change in behavior. °· Headaches. °· Seizures. °· Shortness of breath or difficulty breathing. °· Vision changes. °· Skin ulcers. °Those with the trait may not have symptoms or they may have mild symptoms. °How is this diagnosed? °Sickle cell anemia is diagnosed with blood tests that demonstrate the genetic trait. It is often diagnosed during the newborn period, due to mandatory testing nationwide. A variety of blood tests, X-rays, CT scans, MRI scans, ultrasounds, and lung function tests may also be done to  monitor the condition. °How is this treated? °Sickle cell anemia may be treated with: °· Medicines. You may be given pain medicines, antibiotic medicines (to treat and prevent infections) or medicines to increase the production of certain types of hemoglobin. °· Fluids. °· Oxygen. °· Blood transfusions. ° °Follow these instructions at home: °· Drink enough fluid to keep your urine clear or pale yellow. Increase your fluid intake in hot weather and during exercise. °· Do not smoke. Smoking lowers oxygen levels in the blood. °· Only take over-the-counter or prescription medicines for pain, fever, or discomfort as directed by your health care provider. °· Take antibiotics as directed by your health care provider. Make sure you finish them it even if you start to feel better. °· Take supplements as directed by your health care provider. °· Consider wearing a medical alert bracelet. This tells anyone caring for you in an emergency of your condition. °· When traveling, keep your medical information, health care provider's names, and the medicines you take with you at all times. °· If you develop a fever, do not take medicines to reduce the fever right away. This could cover up a problem that is developing. Notify your health care provider. °· Keep all follow-up appointments with your health care provider. Sickle cell anemia requires regular medical care. °Contact a health care provider if: °You have a fever. °Get help right away if: °· You feel dizzy or faint. °· You have new abdominal pain, especially on the left side near the stomach area. °· You develop a persistent, often uncomfortable and painful penile erection (priapism). If this is not   treated immediately it will lead to impotence. °· You have numbness your arms or legs or you have a hard time moving them. °· You have a hard time with speech. °· You have a fever or persistent symptoms for more than 2-3 days. °· You have a fever and your symptoms suddenly get  worse. °· You have signs or symptoms of infection. These include: °? Chills. °? Abnormal tiredness (lethargy). °? Irritability. °? Poor eating. °? Vomiting. °· You develop pain that is not helped with medicine. °· You develop shortness of breath. °· You have pain in your chest. °· You are coughing up pus-like or bloody sputum. °· You develop a stiff neck. °· Your feet or hands swell or have pain. °· Your abdomen appears bloated. °· You develop joint pain. °This information is not intended to replace advice given to you by your health care provider. Make sure you discuss any questions you have with your health care provider. °Document Released: 07/20/2005 Document Revised: 10/30/2015 Document Reviewed: 11/21/2012 °Elsevier Interactive Patient Education © 2017 Elsevier Inc. ° °

## 2016-12-12 NOTE — Progress Notes (Signed)
[facility] from the office side with c/o pain in his left leg. He states his pain is 4/10. He was treated with rest, IV fluids, Dilaudid and Toradol pushes (one each). He remain at 4/10 at discharge. He was told that he come back in the morning if he was not better. He was asked to call first so we can triage him. Pt voiced understanding. He received his d/c instructions with verbal understanding. Pt was alert, oriented and ambulatory at discharge.

## 2016-12-12 NOTE — Telephone Encounter (Signed)
Patient notified and will come back in for labs

## 2016-12-14 ENCOUNTER — Ambulatory Visit (HOSPITAL_COMMUNITY)
Admission: RE | Admit: 2016-12-14 | Discharge: 2016-12-14 | Disposition: A | Discharge status: Home / Self Care | Payer: BLUE CROSS/BLUE SHIELD | Source: Ambulatory Visit | Attending: Family Medicine | Admitting: Family Medicine

## 2016-12-14 DIAGNOSIS — M79662 Pain in left lower leg: Secondary | ICD-10-CM | POA: Insufficient documentation

## 2016-12-14 LAB — POCT URINALYSIS DIP (DEVICE)
Bilirubin Urine: NEGATIVE
GLUCOSE, UA: NEGATIVE mg/dL
Hgb urine dipstick: NEGATIVE
KETONES UR: NEGATIVE mg/dL
LEUKOCYTES UA: NEGATIVE
Nitrite: NEGATIVE
Protein, ur: NEGATIVE mg/dL
SPECIFIC GRAVITY, URINE: 1.01 (ref 1.005–1.030)
UROBILINOGEN UA: 0.2 mg/dL (ref 0.0–1.0)
pH: 7 (ref 5.0–8.0)

## 2016-12-14 NOTE — Progress Notes (Signed)
*  PRELIMINARY RESULTS* Vascular Ultrasound Left lower extremity venous duplex has been completed.  Preliminary findings: No evidence of deep vein thrombosis or baker's cyst in the left lower extremity.  Preliminary results called to Cammie Sickle @ 10:45.   Ryan Fowler 12/14/2016, 10:46 AM

## 2017-01-02 ENCOUNTER — Telehealth: Payer: Self-pay

## 2017-01-02 MED ORDER — HYDROXYUREA 500 MG PO CAPS: 2000.0000 mg | ORAL_CAPSULE | Freq: Every day | ORAL | 3 refills | Status: DC

## 2017-01-02 NOTE — Telephone Encounter (Signed)
Refill for hydroxyurea sent into pharmacy. Thanks!

## 2017-02-08 ENCOUNTER — Ambulatory Visit (INDEPENDENT_AMBULATORY_CARE_PROVIDER_SITE_OTHER): Payer: BLUE CROSS/BLUE SHIELD | Admitting: Family Medicine

## 2017-02-08 ENCOUNTER — Encounter: Payer: Self-pay | Admitting: Family Medicine

## 2017-02-08 VITALS — BP 126/66 | HR 89 | Temp 98.6°F | Resp 16 | Ht 67.0 in | Wt 161.0 lb

## 2017-02-08 DIAGNOSIS — F4321 Adjustment disorder with depressed mood: Secondary | ICD-10-CM

## 2017-02-08 NOTE — Patient Instructions (Signed)
Referral to Dr. Levy Pupa

## 2017-02-08 NOTE — Progress Notes (Signed)
Subjective:   Ryan Fowler is an 21 y.o. male who presents for evaluation and treatment of depressive symptoms. Ryan Fowler says that he is an introvert and has been feeling down and depressed for greater than a month. 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 He has a history of sickle cell anemia that has been well managed. He is complaining of feeling of anhedonia and sadness. He says that he has been "shutting down" lately. He says that he is aware when it happens and he often alienates his family and friends during these times. Ryan Fowler says that shutting down has always been his defense mechanism.  He says that he has everything, yet he is not happy. He says that his energy level is poor during these times. He has not had difficulty falling asleep or staying asleep. Family history is negative for depression, he says that his father is extremely introverted. He has a close relationship with his mother.  Past Medical History:  Diagnosis Date  . CVA (cerebral infarction)   . Migraine   . Sickle cell anemia (HCC)    Social History   Social History  . Marital status: Single    Spouse name: N/A  . Number of children: N/A  . Years of education: N/A   Occupational History  . Not on file.   Social History Main Topics  . Smoking status: Former Research scientist (life sciences)  . Smokeless tobacco: Never Used  . Alcohol use No  . Drug use: No  . Sexual activity: Not on file   Other Topics Concern  . Not on file   Social History Narrative  . No narrative on file   Immunization History  Administered Date(s) Administered  . HPV Quadrivalent 03/27/2015  . Influenza,inj,Quad PF,6+ Mos 03/12/2015, 03/05/2016  . Pneumococcal Conjugate-13 12/06/2016  . Pneumococcal Polysaccharide-23 03/05/2016   Review of Systems  Constitutional: Negative.   Cardiovascular: Negative.   Gastrointestinal: Negative.   Genitourinary:  Negative.   Musculoskeletal: Negative.   Neurological: Negative.   Psychiatric/Behavioral: Positive for depression. Negative for hallucinations, memory loss, substance abuse and suicidal ideas. The Ryan is not nervous/anxious and does not have insomnia.    Objective:     Physical Exam  Cardiovascular: Normal rate, normal heart sounds and intact distal pulses.   Pulmonary/Chest: Effort normal and breath sounds normal.  Abdominal: Soft. Bowel sounds are normal.  Psychiatric: His mood appears not anxious. His affect is not blunt, not labile and not inappropriate. He is not agitated. He does not express impulsivity. He exhibits a depressed mood. He expresses no suicidal plans and no homicidal plans. He is not apathetic and not concerned with wish fulfillment. He exhibits ordered thought content, normal new learning ability, normal recent memory and normal remote memory. He has a flat affect.   Mental Status Examination: Posture and motor behavior: appropriate Dress, grooming, personal hygiene: appropriate Assessment:   Experiencing the following symptoms of depression most of the day nearly every day for more than two consecutive weeks: depressed mood, change in psychomotor activity  Depression screen [facility] 2/9 02/08/2017 12/12/2016 12/06/2016 05/27/2016 05/04/2016  Decreased Interest 2 0 0 0 0  Down, Depressed, Hopeless 0 0 0 0 0  PHQ - 2 Score 2 0 0 0 0  Altered sleeping 0 - - - -  Tired, decreased energy 1 - - - -  Change in appetite 0 - - - -  Feeling bad or failure about yourself  0 - - - -  Trouble concentrating 2 - - - -  Moving slowly or fidgety/restless 2 - - - -  Suicidal thoughts 0 - - - -  PHQ-9 Score 7 - - - -  Difficult doing work/chores Very difficult - - - -    Plan:   Situational depression - Plan: Ambulatory referral to Psychology Sent referral to Dr. Levy Pupa at the [facility].  No medications warranted at this time.    Reviewed concept of depression as  biochemical imbalance of neurotransmitters and rationale for treatment. Instructed Ryan to contact office or on-call physician promptly should condition worsen or any new symptoms appear and provided on-call telephone numbers.     RTC: As previously scheduled for sickle cell anemia Ryan Pounds  MSN, FNP-C Ryan Fowler [address] [place], [place] [address] [phone]

## 2017-02-09 ENCOUNTER — Telehealth: Payer: Self-pay

## 2017-02-09 NOTE — Telephone Encounter (Signed)
Faxed in referral to Dr. Hart Carwin with [facility] today 02/09/2017 @2 :09pm. Thanks!

## 2017-02-09 NOTE — Telephone Encounter (Signed)
-----   Message from Dorena Dew, FNP sent at 02/09/2017  5:35 AM EDT ----- Note is completed for Mr. Hershberger, please schedule appt at the [facility] group with Dr. Hart Carwin or one of her associates.   Thanks

## 2017-03-22 ENCOUNTER — Telehealth: Payer: Self-pay

## 2017-03-22 MED ORDER — METANX 3-90.314-2-35 MG PO CAPS: 1.0000 | ORAL_CAPSULE | ORAL | 11 refills | Status: AC

## 2017-03-22 NOTE — Telephone Encounter (Signed)
Refill for metanx sent into pharmacy. Thanks!

## 2017-03-27 ENCOUNTER — Ambulatory Visit: Payer: BLUE CROSS/BLUE SHIELD | Admitting: Family Medicine

## 2017-03-27 ENCOUNTER — Encounter: Payer: Self-pay | Admitting: Family Medicine

## 2017-03-27 VITALS — BP 130/52 | HR 56 | Temp 97.8°F | Resp 14 | Ht 67.0 in | Wt 166.0 lb

## 2017-03-27 DIAGNOSIS — F4321 Adjustment disorder with depressed mood: Secondary | ICD-10-CM

## 2017-03-27 DIAGNOSIS — D5700 Hb-SS disease with crisis, unspecified: Secondary | ICD-10-CM

## 2017-03-27 DIAGNOSIS — E559 Vitamin D deficiency, unspecified: Secondary | ICD-10-CM | POA: Diagnosis not present

## 2017-03-27 MED ORDER — METANX 3-90.314-2-35 MG PO CAPS: 1.0000 | ORAL_CAPSULE | Freq: Every day | ORAL | 5 refills | Status: AC

## 2017-03-27 NOTE — Progress Notes (Signed)
Subjective:   Ryan Fowler is an 21 y.o. male with a history of sickle cell anemia and  of depressive symptoms presents for follow up. Ryan Fowler was evaluated 1 month ago for depressive symptoms. He says that he is an introvert and has been feeling down and depressed for greater than a month. 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 He has a history of sickle cell anemia that has been well managed. He says that symptoms of anhedonia has improved. He has attended 3 counseling that have been very helpful.   Sickle cell anemia has been controlled on current medication regimen. Patient primarily uses Ibuprofen to control sickle cell pain. He also takes Hydrea and folic acid consistently. Patient denies headache, shortness of breath, pain, dysuria, abdominal pain, nausea, vomiting,or diarrhea.  Past Medical History:  Diagnosis Date  . CVA (cerebral infarction)   . Migraine   . Sickle cell anemia (HCC)    Social History   Socioeconomic History  . Marital status: Single    Spouse name: Not on file  . Number of children: Not on file  . Years of education: Not on file  . Highest education level: Not on file  Social Needs  . Financial resource strain: Not on file  . Food insecurity - worry: Not on file  . Food insecurity - inability: Not on file  . Transportation needs - medical: Not on file  . Transportation needs - non-medical: Not on file  Occupational History  . Not on file  Tobacco Use  . Smoking status: Former Research scientist (life sciences)  . Smokeless tobacco: Never Used  Substance and Sexual Activity  . Alcohol use: No  . Drug use: No  . Sexual activity: Not on file  Other Topics Concern  . Not on file  Social History Narrative  . Not on file   Immunization History  Administered Date(s) Administered  . HPV Quadrivalent 03/27/2015  . Influenza,inj,Quad PF,6+ Mos 03/12/2015, 03/05/2016  . Pneumococcal  Conjugate-13 12/06/2016  . Pneumococcal Polysaccharide-23 03/05/2016   Review of Systems  Constitutional: Negative.   Cardiovascular: Negative.   Gastrointestinal: Negative.   Genitourinary: Negative.   Musculoskeletal: Negative.   Neurological: Negative.   Psychiatric/Behavioral: Negative for hallucinations, memory loss, substance abuse and suicidal ideas. The patient is not nervous/anxious and does not have insomnia.    Objective:     Physical Exam  Cardiovascular: Normal rate, normal heart sounds and intact distal pulses.  Pulmonary/Chest: Effort normal and breath sounds normal.  Abdominal: Soft. Bowel sounds are normal.  Psychiatric: His mood appears not anxious. His affect is not blunt, not labile and not inappropriate. He is not agitated. He does not express impulsivity. He does not exhibit a depressed mood. He expresses no suicidal plans and no homicidal plans. He is not apathetic and not concerned with wish fulfillment. He exhibits ordered thought content, normal new learning ability, normal recent memory and normal remote memory. He has a flat affect.   Assessment:    1. Sickle cell pain crisis (HCC) Sickle cell disease - Continue Hydrea. We discussed the need for good hydration, monitoring of hydration status, avoidance of heat, cold, stress, and infection triggers. We discussed the risks and benefits of Hydrea, including bone marrow suppression, the possibility of GI upset, skin ulcers, hair thinning, and teratogenicity. The patient was reminded of the need to seek medical attention of any symptoms of  bleeding, anemia, or infection. Continue folic acid 1 mg daily to prevent aplastic bone marrow crises.    Pulmonary evaluation - Patient denies severe recurrent wheezes, shortness of breath with exercise, or persistent cough. If these symptoms develop, pulmonary function tests with spirometry will be ordered, and if abnormal, plan on referral to Pulmonology for further  evaluation.  Cardiac - Routine screening for pulmonary hypertension is not recommended.  Eye - High risk of proliferative retinopathy. Annual eye exam with retinal exam recommended to patient. Previously referred to ophthalmology.   Immunization status - Vaccinations up to date   Acute and chronic painful episodes - Continue Ibuprofen 600 mg every 8 hours as needed for mild to moderate pain.   The above recommendations are taken from the NIH Evidence-Based Management of Sickle Cell Disease: Expert Panel Report, 20149.   Chronic medical problems including diabetes, hypertension and COPD. We recommended she try to find a PCP in [place].  - CBC with Differential - L-Methylfolate-Algae-B12-B6 (METANX) 3-90.314-2-35 MG CAPS; Take 1 each by mouth daily.  Dispense: 90 capsule; Refill: 5 - Reticulocytes  2. Vitamin D deficiency - Vitamin D, 25-hydroxy  3. Situational depression Continue counseling sessions with [facility].    [name]  MSN, FNP-C [facility] [address] [place], [place] [address] [phone]

## 2017-03-27 NOTE — Patient Instructions (Signed)
  Discussed the importance of drinking 64 ounces of water daily. The Importance of Water. To help prevent pain crises, it is important to drink plenty of water throughout the day. This is because dehydration of red blood cells may lead to the sickling process.  ° ° ° °Sickle Cell Anemia, Adult °Sickle cell anemia is a condition where your red blood cells are shaped like sickles. Red blood cells carry oxygen through the body. Sickle-shaped red blood cells do not live as long as normal red blood cells. They also clump together and block blood from flowing through the blood vessels. These things prevent the body from getting enough oxygen. Sickle cell anemia causes organ damage and pain. It also increases the risk of infection. °Follow these instructions at home: °· Drink enough fluid to keep your pee (urine) clear or pale yellow. Drink more in hot weather and during exercise. °· Do not smoke. Smoking lowers oxygen levels in the blood. °· Only take over-the-counter or prescription medicines as told by your doctor. °· Take antibiotic medicines as told by your doctor. Make sure you finish them even if you start to feel better. °· Take supplements as told by your doctor. °· Consider wearing a medical alert bracelet. This tells anyone caring for you in an emergency of your condition. °· When traveling, keep your medical information, doctors' names, and the medicines you take with you at all times. °· If you have a fever, do not take fever medicines right away. This could cover up a problem. Tell your doctor. °· Keep all follow-up visits with your doctor. Sickle cell anemia requires regular medical care. °Contact a doctor if: °You have a fever. °Get help right away if: °· You feel dizzy or faint. °· You have new belly (abdominal) pain, especially on the left side near the stomach area. °· You have a lasting, often uncomfortable and painful erection of the penis (priapism). If it is not treated right away, you will become  unable to have sex (impotence). °· You have numbness in your arms or legs or you have a hard time moving them. °· You have a hard time talking. °· You have a fever or lasting symptoms for more than 2-3 days. °· You have a fever and your symptoms suddenly get worse. °· You have signs or symptoms of infection. These include: °? Chills. °? Being more tired than normal (lethargy). °? Irritability. °? Poor eating. °? Throwing up (vomiting). °· You have pain that is not helped with medicine. °· You have shortness of breath. °· You have pain in your chest. °· You are coughing up pus-like or bloody mucus. °· You have a stiff neck. °· Your feet or hands swell or have pain. °· Your belly looks bloated. °· Your joints hurt. °This information is not intended to replace advice given to you by your health care provider. Make sure you discuss any questions you have with your health care provider. °Document Released: 01/30/2013 Document Revised: 09/17/2015 Document Reviewed: 11/21/2012 °Elsevier Interactive Patient Education © 2017 Elsevier Inc. ° °

## 2017-03-28 ENCOUNTER — Other Ambulatory Visit: Payer: Self-pay | Admitting: Family Medicine

## 2017-03-28 DIAGNOSIS — E559 Vitamin D deficiency, unspecified: Secondary | ICD-10-CM

## 2017-03-28 LAB — CBC WITH DIFFERENTIAL/PLATELET
BASOS: 1 %
Basophils Absolute: 0.1 10*3/uL (ref 0.0–0.2)
EOS (ABSOLUTE): 0.2 10*3/uL (ref 0.0–0.4)
Eos: 2 %
HEMOGLOBIN: 11.5 g/dL — AB (ref 13.0–17.7)
Hematocrit: 31.6 % — ABNORMAL LOW (ref 37.5–51.0)
Immature Grans (Abs): 0 10*3/uL (ref 0.0–0.1)
Immature Granulocytes: 0 %
LYMPHS ABS: 2.8 10*3/uL (ref 0.7–3.1)
Lymphs: 39 %
MCH: 40.6 pg — AB (ref 26.6–33.0)
MCHC: 36.4 g/dL — AB (ref 31.5–35.7)
MCV: 112 fL — AB (ref 79–97)
Monocytes Absolute: 0.7 10*3/uL (ref 0.1–0.9)
Monocytes: 10 %
NEUTROS ABS: 3.5 10*3/uL (ref 1.4–7.0)
Neutrophils: 48 %
Platelets: 348 10*3/uL (ref 150–379)
RBC: 2.83 x10E6/uL — ABNORMAL LOW (ref 4.14–5.80)
RDW: 20 % — ABNORMAL HIGH (ref 12.3–15.4)
WBC: 7.3 10*3/uL (ref 3.4–10.8)

## 2017-03-28 LAB — RETICULOCYTES: Retic Ct Pct: 11 % — ABNORMAL HIGH (ref 0.6–2.6)

## 2017-03-28 LAB — VITAMIN D 25 HYDROXY (VIT D DEFICIENCY, FRACTURES): Vit D, 25-Hydroxy: 10.9 ng/mL — ABNORMAL LOW (ref 30.0–100.0)

## 2017-03-28 MED ORDER — ERGOCALCIFEROL 1.25 MG (50000 UT) PO CAPS: 50000.0000 [IU] | ORAL_CAPSULE | ORAL | 1 refills | Status: AC

## 2017-03-28 NOTE — Progress Notes (Signed)
Meds ordered this encounter  Medications  . ergocalciferol (VITAMIN D2) 50000 units capsule    Sig: Take 1 capsule (50,000 Units total) by mouth once a week.    Dispense:  30 capsule    Refill:  [place]  MSN, FNP-C [facility] [address] [place], [place] [address] [phone]

## 2017-03-29 ENCOUNTER — Telehealth: Payer: Self-pay

## 2017-03-29 NOTE — Telephone Encounter (Signed)
Called and spoke with patient. Advised that vitamin D is still low and to continue vitamin D supplement once weekly. Advised to keep next scheduled appointment. Patient verbalized understanding. Thanks!

## 2017-03-29 NOTE — Telephone Encounter (Signed)
-----   Message from Dorena Dew, [place] sent at 03/28/2017  5:11 PM EST ----- Regarding: lab resutls Please inform patient that vitamin D level is decreased, will continue Drisdol 50, 000 units weekly.  Follow up in office as scheduled   Thanks

## 2017-06-09 ENCOUNTER — Telehealth: Payer: Self-pay

## 2017-06-09 MED ORDER — HYDROXYUREA 500 MG PO CAPS: 2000.0000 mg | ORAL_CAPSULE | Freq: Every day | ORAL | 3 refills | Status: DC

## 2017-06-09 NOTE — Telephone Encounter (Signed)
Refill for hydroxyurea sent into pharmacy. Thanks!

## 2017-06-14 ENCOUNTER — Telehealth: Payer: Self-pay

## 2017-06-14 MED ORDER — FOLIC ACID 1 MG PO TABS: 1.0000 mg | ORAL_TABLET | Freq: Every day | ORAL | 3 refills | Status: DC

## 2017-06-26 ENCOUNTER — Ambulatory Visit: Payer: BLUE CROSS/BLUE SHIELD | Admitting: Family Medicine

## 2017-09-22 ENCOUNTER — Ambulatory Visit (INDEPENDENT_AMBULATORY_CARE_PROVIDER_SITE_OTHER): Payer: BLUE CROSS/BLUE SHIELD | Admitting: Family Medicine

## 2017-09-22 ENCOUNTER — Encounter: Payer: Self-pay | Admitting: Family Medicine

## 2017-09-22 VITALS — BP 111/57 | HR 58 | Temp 98.5°F | Resp 16 | Ht 67.0 in | Wt 167.0 lb

## 2017-09-22 DIAGNOSIS — D571 Sickle-cell disease without crisis: Secondary | ICD-10-CM

## 2017-09-22 DIAGNOSIS — E559 Vitamin D deficiency, unspecified: Secondary | ICD-10-CM

## 2017-09-22 NOTE — Patient Instructions (Signed)
Sickle Cell Anemia, Adult °Sickle cell anemia is a condition where your red blood cells are shaped like sickles. Red blood cells carry oxygen through the body. Sickle-shaped red blood cells do not live as long as normal red blood cells. They also clump together and block blood from flowing through the blood vessels. These things prevent the body from getting enough oxygen. Sickle cell anemia causes organ damage and pain. It also increases the risk of infection. °Follow these instructions at home: °· Drink enough fluid to keep your pee (urine) clear or pale yellow. Drink more in hot weather and during exercise. °· Do not smoke. Smoking lowers oxygen levels in the blood. °· Only take over-the-counter or prescription medicines as told by your doctor. °· Take antibiotic medicines as told by your doctor. Make sure you finish them even if you start to feel better. °· Take supplements as told by your doctor. °· Consider wearing a medical alert bracelet. This tells anyone caring for you in an emergency of your condition. °· When traveling, keep your medical information, doctors' names, and the medicines you take with you at all times. °· If you have a fever, do not take fever medicines right away. This could cover up a problem. Tell your doctor. °· Keep all follow-up visits with your doctor. Sickle cell anemia requires regular medical care. °Contact a doctor if: °You have a fever. °Get help right away if: °· You feel dizzy or faint. °· You have new belly (abdominal) pain, especially on the left side near the stomach area. °· You have a lasting, often uncomfortable and painful erection of the penis (priapism). If it is not treated right away, you will become unable to have sex (impotence). °· You have numbness in your arms or legs or you have a hard time moving them. °· You have a hard time talking. °· You have a fever or lasting symptoms for more than 2-3 days. °· You have a fever and your symptoms suddenly get  worse. °· You have signs or symptoms of infection. These include: °? Chills. °? Being more tired than normal (lethargy). °? Irritability. °? Poor eating. °? Throwing up (vomiting). °· You have pain that is not helped with medicine. °· You have shortness of breath. °· You have pain in your chest. °· You are coughing up pus-like or bloody mucus. °· You have a stiff neck. °· Your feet or hands swell or have pain. °· Your belly looks bloated. °· Your joints hurt. °This information is not intended to replace advice given to you by your health care provider. Make sure you discuss any questions you have with your health care provider. °Document Released: 01/30/2013 Document Revised: 09/17/2015 Document Reviewed: 11/21/2012 °Elsevier Interactive Patient Education © 2017 Elsevier Inc. ° °

## 2017-09-22 NOTE — Progress Notes (Signed)
Subjective:    Patient ID: Ryan Fowler, male    DOB: 30-May-1995, 22 y.o.   MRN: [id number]  HPI Ryan Fowler, a 22 year old male with a history of sickle cell anemia presents for a follow up. He says that he has been doing well and is without complaint. Patient graduated from [employer] one week ago. He will be moving to [place], [place] in 1 month. Patient has not established care with hematologist. He denies pain at present. He consistently takes folic acid and hydroxyurea to prevent vasoocclusive sickle cell crisis. Patient exercises several times per week and follows a balanced diet. He is opiate naive, he doe not take medications for pain. He says that he typically increases fluid intake when joint pain occurs. Ryan Fowler is up to date with yearly eye exam and dental visits. He is sexually active with barrier protection.  Ryan Fowler denies headache, blurred vision, heart palpitations, dysuria, nausea, vomiting or diarrhea.   Past Medical History:  Diagnosis Date  . CVA (cerebral infarction)   . Migraine   . Sickle cell anemia (HCC)    Social History   Socioeconomic History  . Marital status: Single    Spouse name: Not on file  . Number of children: Not on file  . Years of education: Not on file  . Highest education level: Not on file  Occupational History  . Not on file  Social Needs  . Financial resource strain: Not on file  . Food insecurity:    Worry: Not on file    Inability: Not on file  . Transportation needs:    Medical: Not on file    Non-medical: Not on file  Tobacco Use  . Smoking status: Former Research scientist (life sciences)  . Smokeless tobacco: Never Used  Substance and Sexual Activity  . Alcohol use: No  . Drug use: No  . Sexual activity: Not on file  Lifestyle  . Physical activity:    Days per week: Not on file    Minutes per session: Not on file  . Stress: Not on file  Relationships  . Social connections:    Talks on phone: Not on file    Gets together: Not on file    Attends religious  service: Not on file    Active member of club or organization: Not on file    Attends meetings of clubs or organizations: Not on file    Relationship status: Not on file  . Intimate partner violence:    Fear of current or ex partner: Not on file    Emotionally abused: Not on file    Physically abused: Not on file    Forced sexual activity: Not on file  Other Topics Concern  . Not on file  Social History Narrative  . Not on file   Review of Systems  Constitutional: Negative.   HENT: Negative.   Eyes: Negative.   Respiratory: Negative.   Cardiovascular: Negative.   Gastrointestinal: Negative.   Endocrine: Negative.   Musculoskeletal: Negative.   Allergic/Immunologic: Negative.   Neurological: Negative.   Hematological: Negative.   Psychiatric/Behavioral: Negative.        Objective:   Physical Exam  Constitutional: He is oriented to person, place, and time. He appears well-developed and well-nourished.  Eyes: Pupils are equal, round, and reactive to light.  Neck: Normal range of motion. Neck supple.  Cardiovascular: Normal rate, regular rhythm and normal heart sounds.  Pulmonary/Chest: Effort normal and breath sounds normal.  Abdominal: Soft. Bowel sounds  are normal.  Neurological: He is alert and oriented to person, place, and time.  Skin: Skin is warm and dry.  Psychiatric: He has a normal mood and affect. His behavior is normal. Judgment and thought content normal.      BP (!) 111/57 (BP Location: Right Arm, Patient Position: Sitting, Cuff Size: Normal)   Pulse (!) 58   Temp 98.5 F (36.9 C) (Oral)   Resp 16   Ht 5\' 7"  (1.702 m)   Wt 167 lb (75.8 kg)   SpO2 95%   BMI 26.16 kg/m  Assessment & Plan:  1. Hb-SS disease without crisis ([place]) Sickle cell disease - Continue Hydrea 2000 mg daily. Will check CBC for absolute neutrophil count and platelets. Will also review reticulocyte count. We discussed the need for good hydration, monitoring of hydration status,  avoidance of heat, cold, stress, and infection triggers. We discussed the risks and benefits of Hydrea, including bone marrow suppression, the possibility of GI upset, skin ulcers, hair thinning, and teratogenicity. Reminded Ryan Fowler of the need to use contraception while on hydroxyurea due to teratogenicity. The patient was reminded of the need to seek medical attention of any symptoms of bleeding, anemia, or infection. Will continue folic acid 1 mg daily to prevent aplastic bone marrow crises.   Pulmonary evaluation - Patient denies severe recurrent wheezes, shortness of breath with exercise, or persistent cough. If these symptoms develop, pulmonary function tests with spirometry will be ordered, and if abnormal, plan on referral to Pulmonology for further evaluation.  Cardiac - Routine screening for pulmonary hypertension is not recommended.  Eye - High risk of proliferative retinopathy. Annual eye exam with retinal exam recommended to patient. Last eye examination 6 months ago  Immunization status - Patient will receive influenza vaccination on today  Acute and chronic painful episodes - Ryan Fowler is opiate naive and mostly uses hydration to control pain.    - Comprehensive metabolic panel - CBC with Differential - Reticulocytes  2. Vitamin D deficiency  - Vitamin D, 25-hydroxy   Ryan Fowler will be relocating to [place], [place] in 1 month. I recommend that he establish care with [facility]. I will send referral  Sent all medications for 90 days.    [name]  MSN, FNP-C [facility] [address] [place], [place] [address] [phone]

## 2017-09-23 ENCOUNTER — Other Ambulatory Visit: Payer: Self-pay | Admitting: Family Medicine

## 2017-09-23 DIAGNOSIS — R748 Abnormal levels of other serum enzymes: Secondary | ICD-10-CM

## 2017-09-23 LAB — COMPREHENSIVE METABOLIC PANEL
A/G RATIO: 1.4 (ref 1.2–2.2)
ALT: 47 IU/L — AB (ref 0–44)
AST: 53 IU/L — AB (ref 0–40)
Albumin: 4.4 g/dL (ref 3.5–5.5)
Alkaline Phosphatase: 68 IU/L (ref 39–117)
BUN/Creatinine Ratio: 10 (ref 9–20)
BUN: 8 mg/dL (ref 6–20)
Bilirubin Total: 4.1 mg/dL — ABNORMAL HIGH (ref 0.0–1.2)
CALCIUM: 9.2 mg/dL (ref 8.7–10.2)
CO2: 20 mmol/L (ref 20–29)
CREATININE: 0.78 mg/dL (ref 0.76–1.27)
Chloride: 101 mmol/L (ref 96–106)
GFR, EST AFRICAN AMERICAN: 148 mL/min/{1.73_m2} (ref >59)
GFR, EST NON AFRICAN AMERICAN: 128 mL/min/{1.73_m2} (ref >59)
Globulin, Total: 3.1 g/dL (ref 1.5–4.5)
Glucose: 75 mg/dL (ref 65–99)
POTASSIUM: 4.6 mmol/L (ref 3.5–5.2)
Sodium: 137 mmol/L (ref 134–144)
TOTAL PROTEIN: 7.5 g/dL (ref 6.0–8.5)

## 2017-09-23 LAB — CBC WITH DIFFERENTIAL/PLATELET
BASOS: 1 %
Basophils Absolute: 0.1 10*3/uL (ref 0.0–0.2)
EOS (ABSOLUTE): 0.1 10*3/uL (ref 0.0–0.4)
EOS: 2 %
HEMATOCRIT: 29 % — AB (ref 37.5–51.0)
HEMOGLOBIN: 10.3 g/dL — AB (ref 13.0–17.7)
IMMATURE GRANS (ABS): 0.1 10*3/uL (ref 0.0–0.1)
IMMATURE GRANULOCYTES: 1 %
LYMPHS: 44 %
Lymphocytes Absolute: 3.7 10*3/uL — ABNORMAL HIGH (ref 0.7–3.1)
MCH: 38.4 pg — ABNORMAL HIGH (ref 26.6–33.0)
MCHC: 35.5 g/dL (ref 31.5–35.7)
MCV: 108 fL — AB (ref 79–97)
Monocytes Absolute: 0.7 10*3/uL (ref 0.1–0.9)
Monocytes: 9 %
NEUTROS ABS: 3.5 10*3/uL (ref 1.4–7.0)
NEUTROS PCT: 43 %
PLATELETS: 390 10*3/uL (ref 150–450)
RBC: 2.68 x10E6/uL — CL (ref 4.14–5.80)
RDW: 21.6 % — ABNORMAL HIGH (ref 12.3–15.4)
WBC: 8.1 10*3/uL (ref 3.4–10.8)

## 2017-09-23 LAB — RETICULOCYTES: Retic Ct Pct: 11.3 % — ABNORMAL HIGH (ref 0.6–2.6)

## 2017-09-23 LAB — VITAMIN D 25 HYDROXY (VIT D DEFICIENCY, FRACTURES): Vit D, 25-Hydroxy: 46.2 ng/mL (ref 30.0–100.0)

## 2017-09-23 NOTE — Progress Notes (Signed)
Orders Placed This Encounter  Procedures  . Comprehensive metabolic panel    Standing Status:   Future    Standing Expiration Date:   09/24/2018    [name]  MSN, FNP-C [facility] [address] [place], [place] [address] [phone]

## 2017-09-25 ENCOUNTER — Telehealth: Payer: Self-pay

## 2017-09-25 MED ORDER — HYDROXYUREA 500 MG PO CAPS: 2000.0000 mg | ORAL_CAPSULE | Freq: Every day | ORAL | 3 refills | Status: DC

## 2017-09-25 MED ORDER — FOLIC ACID 1 MG PO TABS: 1.0000 mg | ORAL_TABLET | Freq: Every day | ORAL | 3 refills | Status: AC

## 2017-09-25 NOTE — Telephone Encounter (Signed)
-----   Message from Dorena Dew, [place] sent at 09/23/2017  5:54 AM EDT ----- Regarding: lab results Please inform patient that liver enzymes are mildly elevated. Will need to repeat labs in 2 weeks. Refrain from OTC Tylenol or alcohol use.  All other labs consistent with baseline.    Schedule lab appointment in 2 weeks.   [name]  MSN, FNP-C [facility] [address] [place], [place] [address] [phone]

## 2017-09-25 NOTE — Telephone Encounter (Signed)
I called and spoke with patient advised that liver enzymes are mildly elevated and that he needs to repeat labs in 2 weeks. Advised to refrain from otc tylenol or alcohol use. Patient verbalized understanding and was transferred to front desk to schedule 2 week lab appointment. Thanks!

## 2017-10-09 ENCOUNTER — Telehealth: Payer: Self-pay | Admitting: Family Medicine

## 2017-10-09 ENCOUNTER — Ambulatory Visit (HOSPITAL_COMMUNITY)
Admission: RE | Admit: 2017-10-09 | Discharge: 2017-10-09 | Disposition: A | Discharge status: Home / Self Care | Payer: BLUE CROSS/BLUE SHIELD | Source: Ambulatory Visit | Attending: Family Medicine | Admitting: Family Medicine

## 2017-10-09 ENCOUNTER — Ambulatory Visit (INDEPENDENT_AMBULATORY_CARE_PROVIDER_SITE_OTHER): Payer: BLUE CROSS/BLUE SHIELD | Admitting: Family Medicine

## 2017-10-09 ENCOUNTER — Other Ambulatory Visit: Payer: BLUE CROSS/BLUE SHIELD

## 2017-10-09 ENCOUNTER — Encounter: Payer: Self-pay | Admitting: Family Medicine

## 2017-10-09 VITALS — BP 128/58 | HR 61 | Temp 98.5°F | Resp 16 | Ht 67.0 in | Wt 166.0 lb

## 2017-10-09 DIAGNOSIS — N509 Disorder of male genital organs, unspecified: Secondary | ICD-10-CM | POA: Diagnosis not present

## 2017-10-09 DIAGNOSIS — I861 Scrotal varices: Secondary | ICD-10-CM

## 2017-10-09 DIAGNOSIS — R748 Abnormal levels of other serum enzymes: Secondary | ICD-10-CM

## 2017-10-09 DIAGNOSIS — D571 Sickle-cell disease without crisis: Secondary | ICD-10-CM | POA: Diagnosis not present

## 2017-10-09 DIAGNOSIS — N5089 Other specified disorders of the male genital organs: Secondary | ICD-10-CM

## 2017-10-09 LAB — POCT URINALYSIS DIPSTICK
Bilirubin, UA: NEGATIVE
GLUCOSE UA: NEGATIVE
KETONES UA: NEGATIVE
LEUKOCYTES UA: NEGATIVE
NITRITE UA: NEGATIVE
Protein, UA: NEGATIVE
RBC UA: NEGATIVE
SPEC GRAV UA: 1.01 (ref 1.010–1.025)
Urobilinogen, UA: 1 E.U./dL
pH, UA: 7 (ref 5.0–8.0)

## 2017-10-09 NOTE — Telephone Encounter (Signed)
Ryan Fowler, a 22 year old male with a history of sickle cell anemia presented complaining of left testicular discomfort and mass. Patient underwent a testicular ultrasound that showed the following:   FINDINGS: Right testicle  Measurements: 4.0 x 1.9 x 2.6 cm. No mass or microlithiasis visualized.  Left testicle  Measurements: 3.7 x 1.9 x 2.6 cm. No mass or microlithiasis visualized.  Right epididymis:  Normal in size and appearance.  Left epididymis:  Normal in size and appearance.  Hydrocele:  None significant.  A  Varicocele:  Small left-sided varicocele.  Pulsed Doppler interrogation of both testes demonstrates normal low resistance arterial and venous waveforms bilaterally.  IMPRESSION: 1. Small left-sided varicocele. 2. Otherwise unremarkable testicular ultrasound. No other acute abnormality identified.  A referral will be sent to urology for further evaluation.    [name]  MSN, FNP-C [facility] [address] [place], [place] [address] [phone]

## 2017-10-09 NOTE — Progress Notes (Signed)
Subjective:    Patient ID: Ryan Fowler, male    DOB: Jul 16, 1995, 22 y.o.   MRN: [id number]  HPI Ryan Fowler, a 22 year old male with a history of sickle cell anemia, hemoglobin SS presents accompanied by mother for follow-up of sickle cell disease.  Patient is also complaining of small nodule to left testicle for greater than 2 weeks.  [name] states that nodule has become progressively smaller over time.  He characterizes nodule as uncomfortable and tender to touch.  Patient denies fever, chills, headache, dizziness, dysuria, dyspareunia, nausea, vomiting, and/or constipation.  Patient noticed nodule during monthly self testicular exam.    Patient states that he is taking all maintenance medications consistently.  Medications include folic acid and hydroxyurea.  Posterior endorses periodic fatigue and states that he has been experiencing fatigue for greater than a month.  Most recent labs are consistent with baseline.  [name] has continued to hydrate consistently to prevent sickle cell crisis.  Patient attributes current fatigue to recent graduation activities.  He graduated from [employer].  He is also in the process of moving to [place].  Patient has been referred to hematology.   [name] denies headache, blurred vision, heart palpitations, dysuria, nausea, vomiting or diarrhea.   Past Medical History:  Diagnosis Date  . CVA (cerebral infarction)   . Migraine   . Sickle cell anemia (HCC)    Social History   Socioeconomic History  . Marital status: Single    Spouse name: Not on file  . Number of children: Not on file  . Years of education: Not on file  . Highest education level: Not on file  Occupational History  . Not on file  Social Needs  . Financial resource strain: Not on file  . Food insecurity:    Worry: Not on file    Inability: Not on file  . Transportation needs:    Medical: Not on file    Non-medical: Not on file  Tobacco Use  . Smoking status: Former Research scientist (life sciences)  .  Smokeless tobacco: Never Used  Substance and Sexual Activity  . Alcohol use: No  . Drug use: No  . Sexual activity: Not on file  Lifestyle  . Physical activity:    Days per week: Not on file    Minutes per session: Not on file  . Stress: Not on file  Relationships  . Social connections:    Talks on phone: Not on file    Gets together: Not on file    Attends religious service: Not on file    Active member of club or organization: Not on file    Attends meetings of clubs or organizations: Not on file    Relationship status: Not on file  . Intimate partner violence:    Fear of current or ex partner: Not on file    Emotionally abused: Not on file    Physically abused: Not on file    Forced sexual activity: Not on file  Other Topics Concern  . Not on file  Social History Narrative  . Not on file   Review of Systems  Constitutional: Negative.   HENT: Negative.   Eyes: Negative.   Respiratory: Negative.   Cardiovascular: Negative.   Gastrointestinal: Negative.   Endocrine: Negative.   Musculoskeletal: Negative.   Allergic/Immunologic: Negative.   Neurological: Negative.   Hematological: Negative.   Psychiatric/Behavioral: Negative.        Objective:   Physical Exam  Constitutional: He is oriented to person, place,  and time. He appears well-developed and well-nourished.  Eyes: Pupils are equal, round, and reactive to light.  Neck: Normal range of motion. Neck supple.  Cardiovascular: Normal rate, regular rhythm and normal heart sounds.  Pulmonary/Chest: Effort normal and breath sounds normal.  Abdominal: Soft. Bowel sounds are normal.  Neurological: He is alert and oriented to person, place, and time.  Skin: Skin is warm and dry.  Psychiatric: He has a normal mood and affect. His behavior is normal. Judgment and thought content normal.      BP (!) 128/58 (BP Location: Right Arm, Patient Position: Sitting, Cuff Size: Normal)   Pulse 61   Temp 98.5 F (36.9 C) (Oral)    Resp 16   Ht 5\' 7"  (1.702 m)   Wt 166 lb (75.3 kg)   SpO2 97%   BMI 26.00 kg/m  Assessment & Plan:  1. Hb-SS disease without crisis ([place]) Sickle cell disease - Continue Hydrea 2000 mg daily. Will check CBC for absolute neutrophil count and platelets. Will also review reticulocyte count. We discussed the need for good hydration, monitoring of hydration status, avoidance of heat, cold, stress, and infection triggers. We discussed the risks and benefits of Hydrea, including bone marrow suppression, the possibility of GI upset, skin ulcers, hair thinning, and teratogenicity. Reminded Ryan Fowler of the need to use contraception while on hydroxyurea due to teratogenicity. The patient was reminded of the need to seek medical attention of any symptoms of bleeding, anemia, or infection. Will continue folic acid 1 mg daily to prevent aplastic bone marrow crises.   Pulmonary evaluation - Patient denies severe recurrent wheezes, shortness of breath with exercise, or persistent cough. If these symptoms develop, pulmonary function tests with spirometry will be ordered, and if abnormal, plan on referral to Pulmonology for further evaluation.  Cardiac - Routine screening for pulmonary hypertension is not recommended.  Eye - High risk of proliferative retinopathy. Annual eye exam with retinal exam recommended to patient. Last eye examination 6 months ago  Immunization status - Patient will receive influenza vaccination on today  Acute and chronic painful episodes - [name] is opiate naive and mostly uses hydration to control pain.   - CBC with Differential  2. Testicular lump Unable to palpate testicular lump on physical exam.  Will send a stat order for ultrasound of scrotum. - RPR - GC/Chlamydia Probe Amp([facility]) - Urinalysis Dipstick - US SCROTUM W/DOPPLER; Future  3. Left varicocele Reviewed ultrasound of scrotum shows small left side varicocele, otherwise unremarkable.  Due to increased tenderness,  will send a referral to urology for further work-up and evaluation. - Ambulatory referral to Urology     The patient was given clear instructions to go to ER or return to medical center if symptoms do not improve, worsen or new problems develop. The patient verbalized understanding.    [name]  MSN, FNP-C [facility] [address] [place], [place] [address] [phone]

## 2017-10-10 LAB — CBC WITH DIFFERENTIAL/PLATELET
Basophils Absolute: 0.1 10*3/uL (ref 0.0–0.2)
Basos: 1 %
EOS (ABSOLUTE): 0.1 10*3/uL (ref 0.0–0.4)
EOS: 2 %
HEMATOCRIT: 30.1 % — AB (ref 37.5–51.0)
Hemoglobin: 10.1 g/dL — ABNORMAL LOW (ref 13.0–17.7)
IMMATURE GRANULOCYTES: 0 %
Immature Grans (Abs): 0 10*3/uL (ref 0.0–0.1)
Lymphocytes Absolute: 2.8 10*3/uL (ref 0.7–3.1)
Lymphs: 43 %
MCH: 37.3 pg — ABNORMAL HIGH (ref 26.6–33.0)
MCHC: 33.6 g/dL (ref 31.5–35.7)
MCV: 111 fL — ABNORMAL HIGH (ref 79–97)
MONOCYTES: 12 %
MONOS ABS: 0.8 10*3/uL (ref 0.1–0.9)
NEUTROS PCT: 42 %
Neutrophils Absolute: 2.6 10*3/uL (ref 1.4–7.0)
Platelets: 350 10*3/uL (ref 150–450)
RBC: 2.71 x10E6/uL — AB (ref 4.14–5.80)
RDW: 21 % — AB (ref 12.3–15.4)
WBC: 6.3 10*3/uL (ref 3.4–10.8)

## 2017-10-10 LAB — COMPREHENSIVE METABOLIC PANEL
ALK PHOS: 68 IU/L (ref 39–117)
ALT: 13 IU/L (ref 0–44)
AST: 27 IU/L (ref 0–40)
Albumin/Globulin Ratio: 1.8 (ref 1.2–2.2)
Albumin: 4.7 g/dL (ref 3.5–5.5)
BUN/Creatinine Ratio: 11 (ref 9–20)
BUN: 8 mg/dL (ref 6–20)
Bilirubin Total: 3.9 mg/dL — ABNORMAL HIGH (ref 0.0–1.2)
CO2: 21 mmol/L (ref 20–29)
CREATININE: 0.73 mg/dL — AB (ref 0.76–1.27)
Calcium: 9.1 mg/dL (ref 8.7–10.2)
Chloride: 100 mmol/L (ref 96–106)
GFR calc Af Amer: 152 mL/min/{1.73_m2} (ref >59)
GFR calc non Af Amer: 132 mL/min/{1.73_m2} (ref >59)
GLUCOSE: 88 mg/dL (ref 65–99)
Globulin, Total: 2.6 g/dL (ref 1.5–4.5)
Potassium: 4.8 mmol/L (ref 3.5–5.2)
SODIUM: 138 mmol/L (ref 134–144)
Total Protein: 7.3 g/dL (ref 6.0–8.5)

## 2017-10-10 LAB — RPR: RPR: NONREACTIVE

## 2017-10-11 LAB — GC/CHLAMYDIA PROBE AMP
CHLAMYDIA, DNA PROBE: NEGATIVE
NEISSERIA GONORRHOEAE BY PCR: NEGATIVE

## 2017-11-03 ENCOUNTER — Other Ambulatory Visit: Payer: Self-pay

## 2017-11-03 DIAGNOSIS — D571 Sickle-cell disease without crisis: Secondary | ICD-10-CM

## 2017-11-03 MED ORDER — HYDROXYUREA 500 MG PO CAPS: 2000.0000 mg | ORAL_CAPSULE | Freq: Every day | ORAL | 3 refills | Status: AC

## 2017-11-15 IMAGING — DX DG CHEST 2V
2 series · 2 of 2 positions shown · non-contrast
Comparison: None.

CLINICAL DATA: Chest, rib and back pain in a patient with sickle
cell disease.

EXAM:
CHEST  2 VIEW

[chest pa]
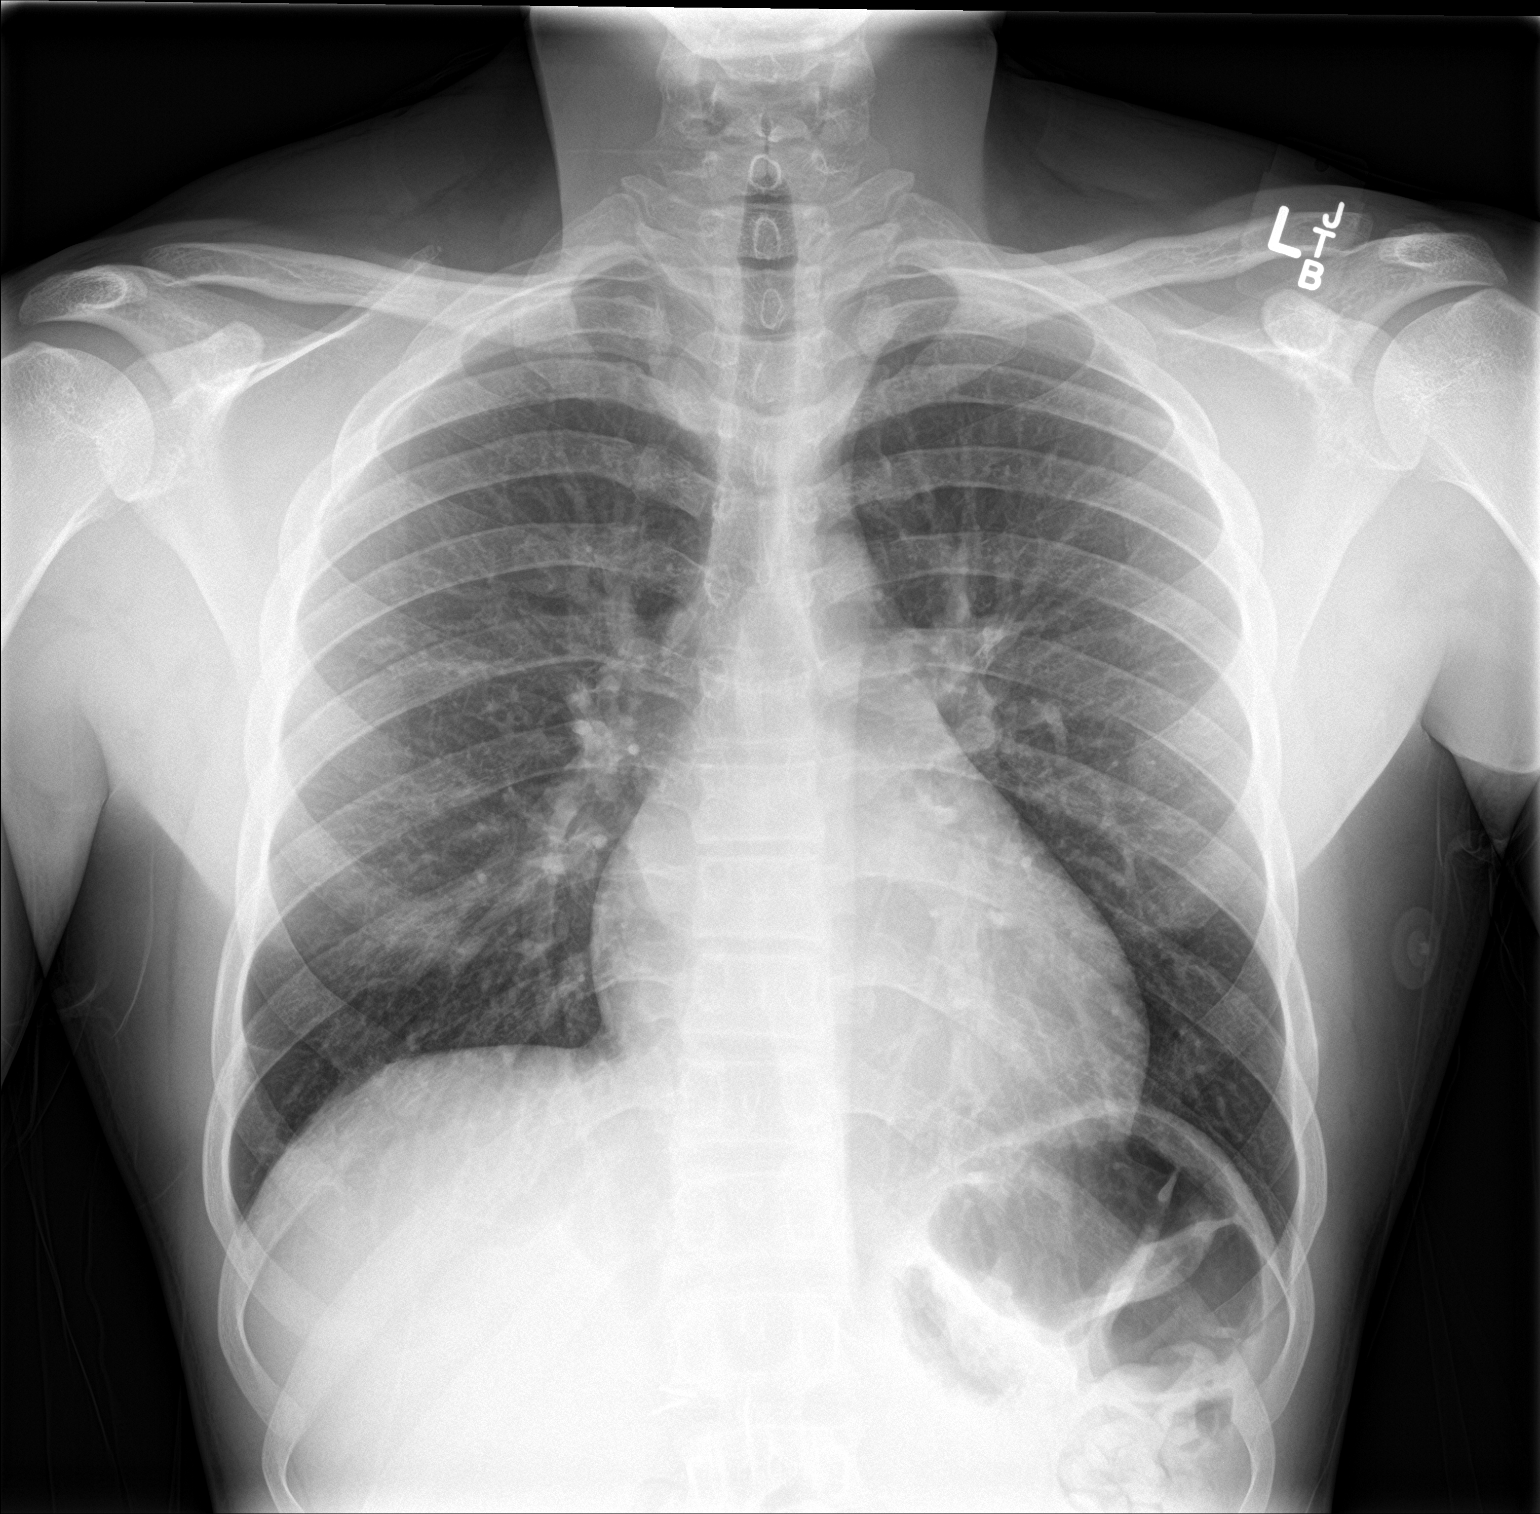

[chest lat]
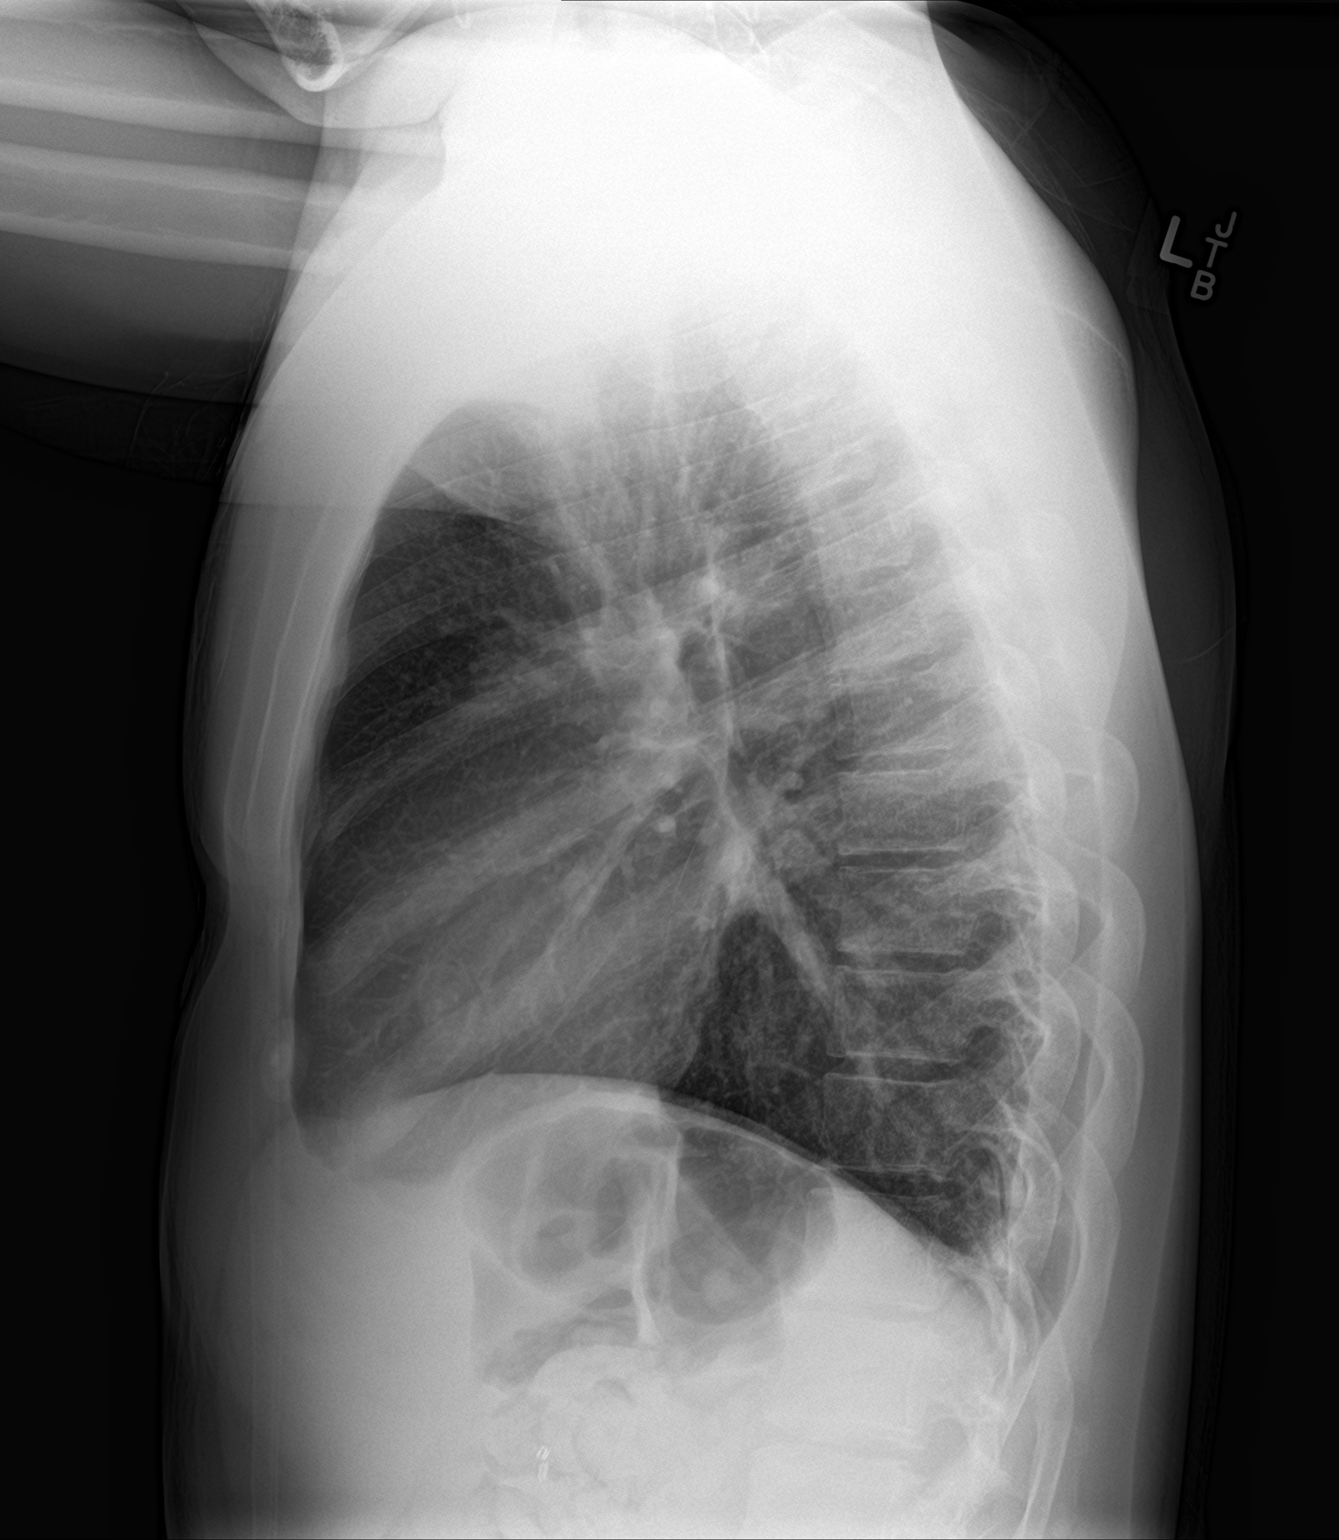

[2 of 2 positions shown; findings below may reference images not displayed]

FINDINGS: The lungs are clear. Heart size is normal. No pneumothorax or
pleural effusion. No bony abnormality is identified. Cholecystectomy
clips are noted.
IMPRESSION: Negative chest.

## 2018-03-30 ENCOUNTER — Telehealth: Payer: Self-pay

## 2018-03-30 NOTE — Telephone Encounter (Signed)
Called and spoke with patient. Last office note/labs and demographics have been faxed. Thanks!

## 2018-08-24 IMAGING — US US SCROTUM W/ DOPPLER COMPLETE
1 series · 14 of 25 positions shown · non-contrast
Comparison: None.

CLINICAL DATA: Initial evaluation for left-sided testicular lump
for 3 weeks.

EXAM:
SCROTAL ULTRASOUND
DOPPLER ULTRASOUND OF THE TESTICLES
TECHNIQUE: Complete ultrasound examination of the testicles, epididymis, and
other scrotal structures was performed. Color and spectral Doppler
ultrasound were also utilized to evaluate blood flow to the
testicles.

[Series 1: us scrotum w/ doppler complete · 0.06mm/px · 14 of 62 slices shown]
[im 1/62]
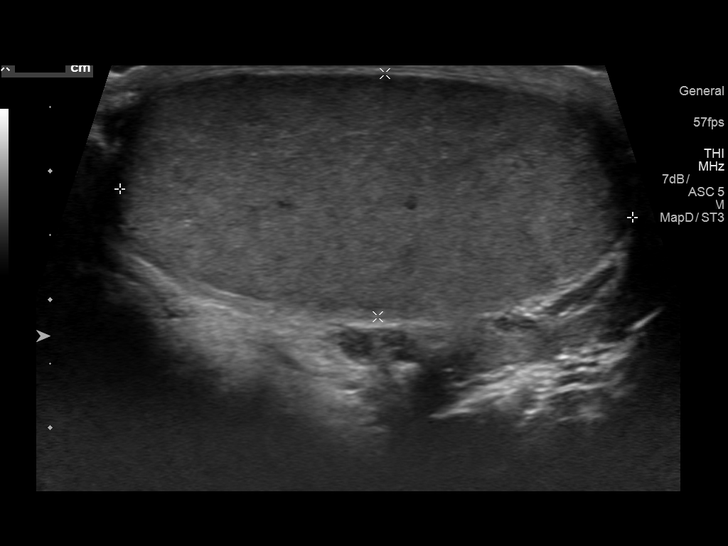
[im 6/62]
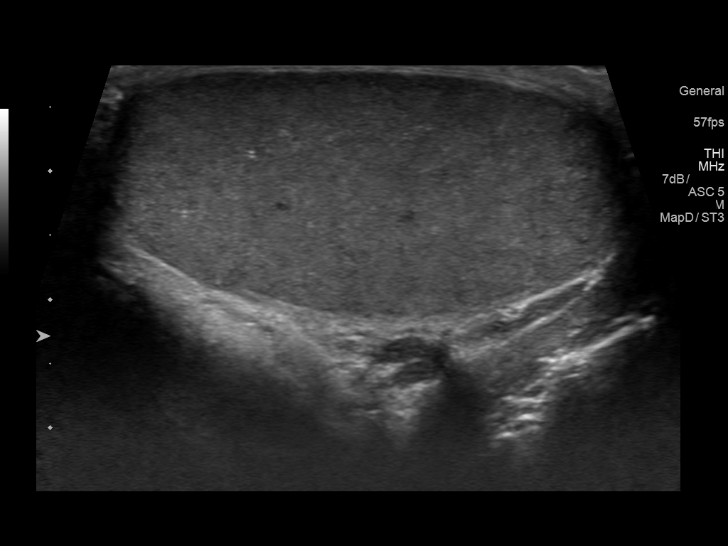
[im 11/62]
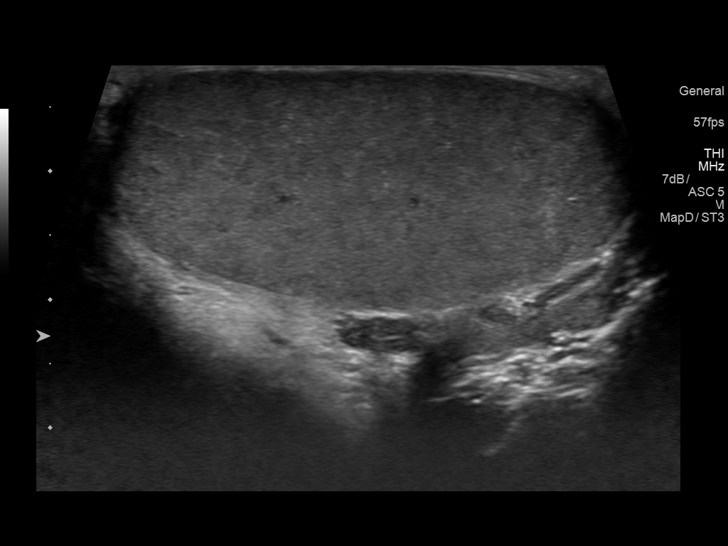
[im 16/62]
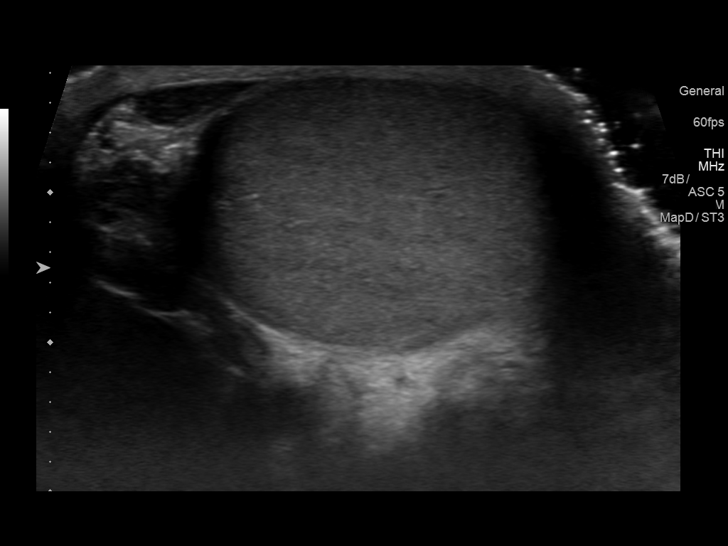
[im 21/62]
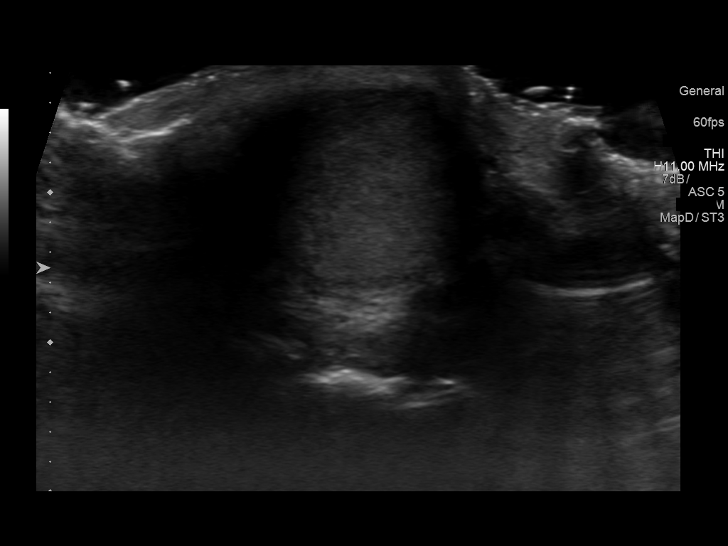
[im 23/62]
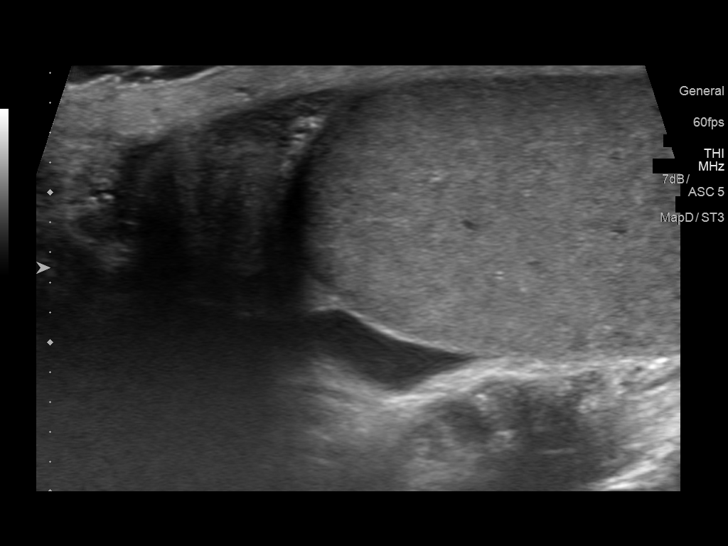
[im 28/62]
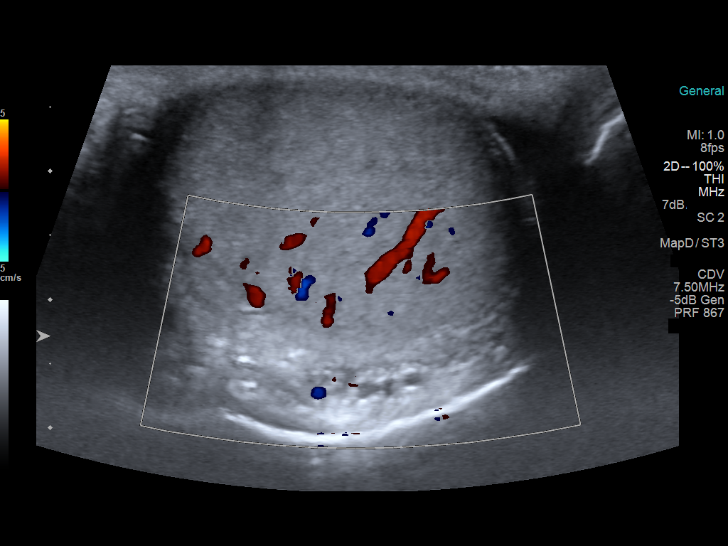
[im 34/62]
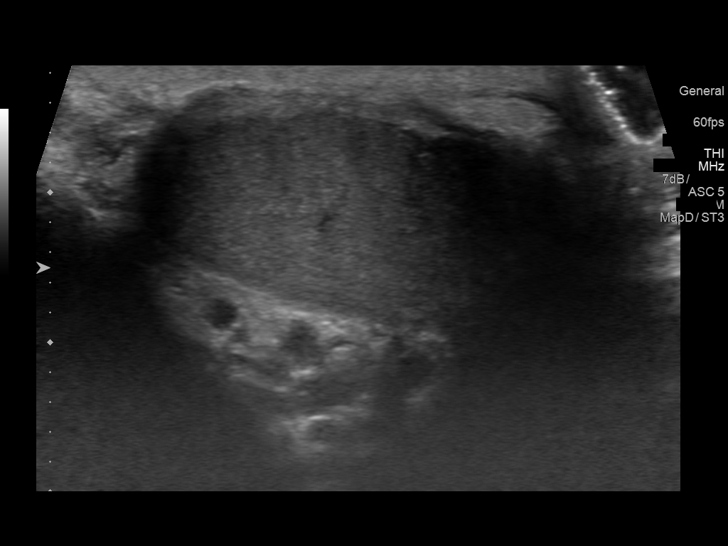
[im 39/62]
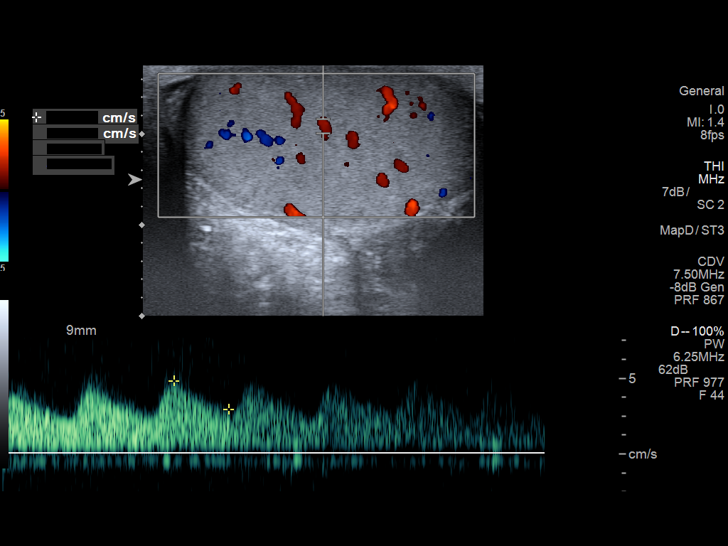
[im 41/62]
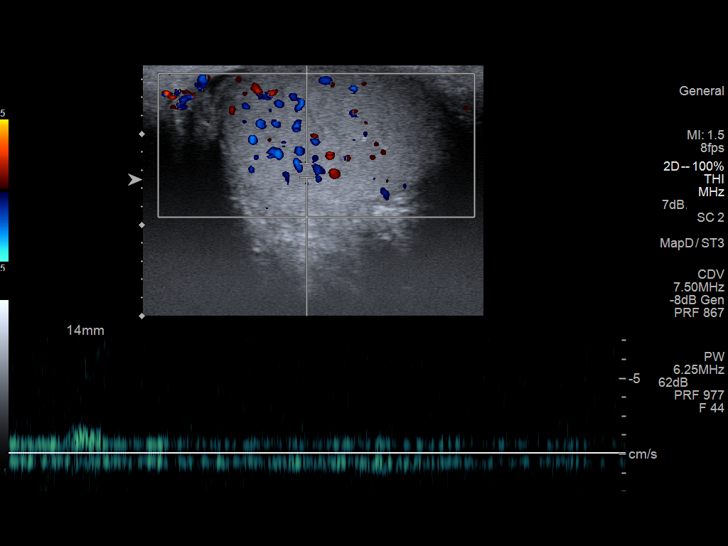
[im 46/62]
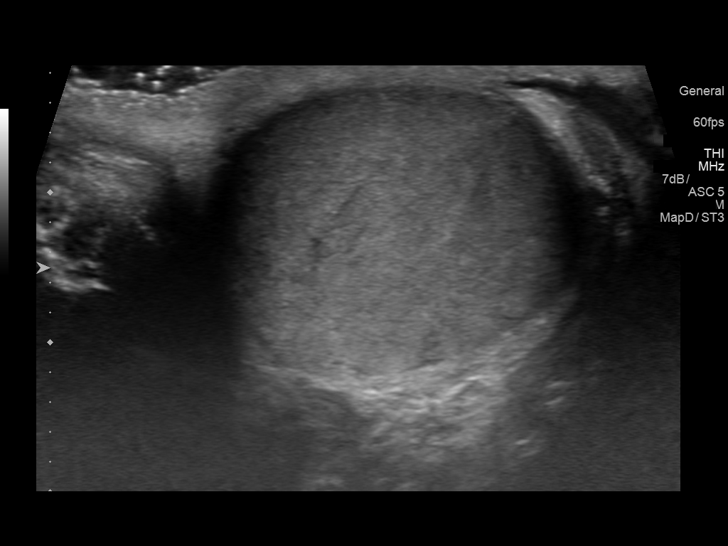
[im 51/62]
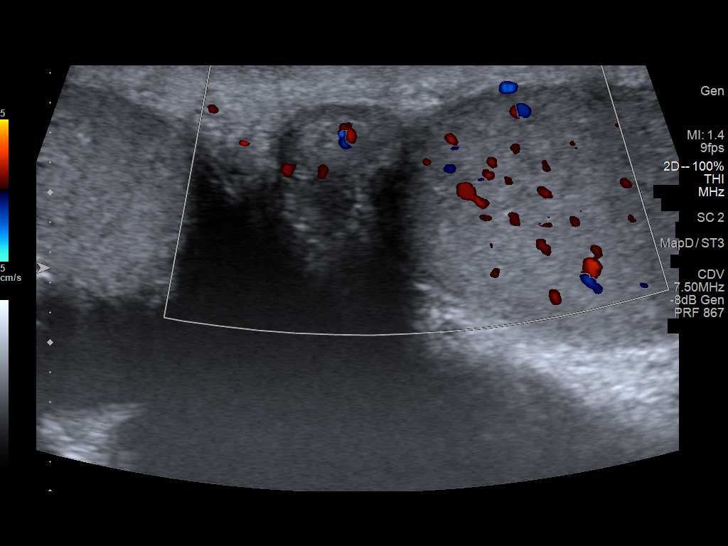
[im 56/62]
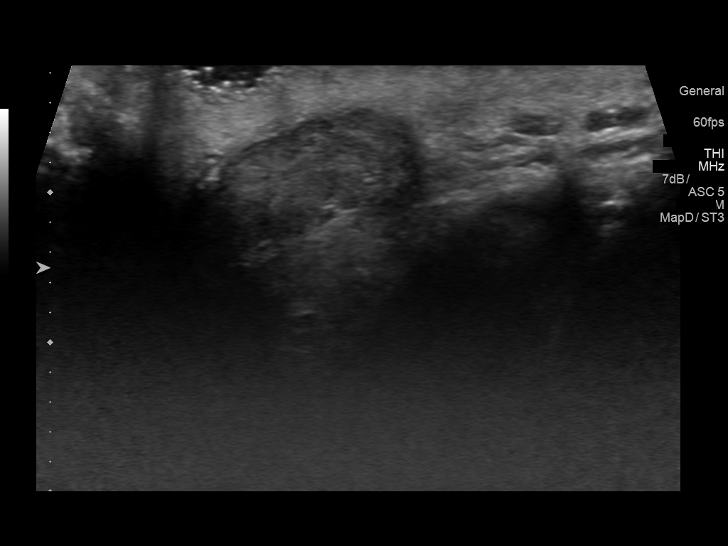
[im 62/62]
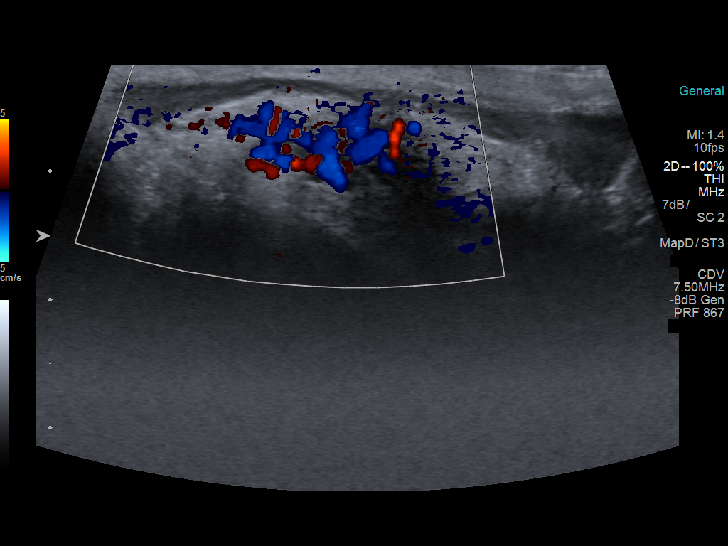

[14 of 25 positions shown; findings below may reference images not displayed]

FINDINGS: Right testicle

Measurements: 4.0 x 1.9 x 2.6 cm. No mass or microlithiasis
visualized.

Left testicle

Measurements: 3.7 x 1.9 x 2.6 cm. No mass or microlithiasis
visualized.

Right epididymis:  Normal in size and appearance.

Left epididymis:  Normal in size and appearance.

Hydrocele:  None significant.  A

Varicocele:  Small left-sided varicocele.

Pulsed Doppler interrogation of both testes demonstrates normal low
resistance arterial and venous waveforms bilaterally.
IMPRESSION: 1. Small left-sided varicocele.
2. Otherwise unremarkable testicular ultrasound. No other acute
abnormality identified.

## 2018-08-30 NOTE — Telephone Encounter (Signed)
Message sent to provider 

## 2018-11-12 ENCOUNTER — Other Ambulatory Visit: Payer: Self-pay | Admitting: Family Medicine

## 2018-11-12 DIAGNOSIS — D5700 Hb-SS disease with crisis, unspecified: Secondary | ICD-10-CM
